# Patient Record
Sex: Female | Born: 1960 | Race: White | Hispanic: No | Smoking: Former smoker
Health system: Southern US, Community
[De-identification: ages and names within clinical notes are randomized; demographics above are authoritative.]

## PROBLEM LIST (undated history)

## (undated) DIAGNOSIS — F32A Depression, unspecified: Secondary | ICD-10-CM

## (undated) DIAGNOSIS — H4089 Other specified glaucoma: Secondary | ICD-10-CM

## (undated) DIAGNOSIS — F419 Anxiety disorder, unspecified: Secondary | ICD-10-CM

## (undated) DIAGNOSIS — F329 Major depressive disorder, single episode, unspecified: Secondary | ICD-10-CM

## (undated) DIAGNOSIS — H5712 Ocular pain, left eye: Secondary | ICD-10-CM

## (undated) DIAGNOSIS — C9110 Chronic lymphocytic leukemia of B-cell type not having achieved remission: Secondary | ICD-10-CM

## (undated) DIAGNOSIS — G44009 Cluster headache syndrome, unspecified, not intractable: Secondary | ICD-10-CM

## (undated) HISTORY — PX: REDUCTION MAMMAPLASTY: SUR839

## (undated) HISTORY — DX: Chronic lymphocytic leukemia of B-cell type not having achieved remission: C91.10

## (undated) HISTORY — DX: Depression, unspecified: F32.A

## (undated) HISTORY — DX: Other specified glaucoma: H40.89

## (undated) HISTORY — DX: Ocular pain, left eye: H57.12

## (undated) HISTORY — DX: Cluster headache syndrome, unspecified, not intractable: G44.009

## (undated) HISTORY — PX: CERVICAL DISC SURGERY: SHX588

## (undated) HISTORY — DX: Anxiety disorder, unspecified: F41.9

---
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
## 1999-09-05 ENCOUNTER — Encounter: Payer: Self-pay | Admitting: Neurosurgery

## 1999-09-10 ENCOUNTER — Encounter: Payer: Self-pay | Admitting: Neurosurgery

## 1999-09-10 ENCOUNTER — Inpatient Hospital Stay (HOSPITAL_COMMUNITY): Admission: RE | Admit: 1999-09-10 | Discharge: 1999-09-11 | Payer: Self-pay | Admitting: Neurosurgery

## 1999-09-30 ENCOUNTER — Encounter: Payer: Self-pay | Admitting: Neurosurgery

## 1999-09-30 ENCOUNTER — Encounter: Admission: RE | Admit: 1999-09-30 | Discharge: 1999-09-30 | Payer: Self-pay | Admitting: Neurosurgery

## 1999-11-21 ENCOUNTER — Encounter: Admission: RE | Admit: 1999-11-21 | Discharge: 1999-11-21 | Payer: Self-pay | Admitting: Neurosurgery

## 1999-11-21 ENCOUNTER — Encounter: Payer: Self-pay | Admitting: Neurosurgery

## 2000-06-11 ENCOUNTER — Other Ambulatory Visit: Admission: RE | Admit: 2000-06-11 | Discharge: 2000-06-11 | Payer: Self-pay | Admitting: Obstetrics and Gynecology

## 2001-04-23 ENCOUNTER — Other Ambulatory Visit: Admission: RE | Admit: 2001-04-23 | Discharge: 2001-04-23 | Payer: Self-pay | Admitting: Obstetrics and Gynecology

## 2003-01-03 ENCOUNTER — Other Ambulatory Visit: Admission: RE | Admit: 2003-01-03 | Discharge: 2003-01-03 | Payer: Self-pay | Admitting: Obstetrics and Gynecology

## 2003-01-10 ENCOUNTER — Encounter: Admission: RE | Admit: 2003-01-10 | Discharge: 2003-01-10 | Payer: Self-pay | Admitting: Obstetrics and Gynecology

## 2003-01-10 ENCOUNTER — Encounter: Payer: Self-pay | Admitting: Obstetrics and Gynecology

## 2003-01-13 ENCOUNTER — Encounter: Admission: RE | Admit: 2003-01-13 | Discharge: 2003-01-13 | Payer: Self-pay | Admitting: Obstetrics and Gynecology

## 2003-01-13 ENCOUNTER — Encounter: Payer: Self-pay | Admitting: Obstetrics and Gynecology

## 2003-07-12 ENCOUNTER — Ambulatory Visit (HOSPITAL_COMMUNITY): Admission: RE | Admit: 2003-07-12 | Discharge: 2003-07-12 | Payer: Self-pay | Admitting: Obstetrics and Gynecology

## 2004-06-07 ENCOUNTER — Other Ambulatory Visit: Admission: RE | Admit: 2004-06-07 | Discharge: 2004-06-07 | Payer: Self-pay | Admitting: *Deleted

## 2005-05-09 ENCOUNTER — Ambulatory Visit (HOSPITAL_COMMUNITY): Admission: RE | Admit: 2005-05-09 | Discharge: 2005-05-09 | Payer: Self-pay | Admitting: Obstetrics and Gynecology

## 2005-11-20 ENCOUNTER — Other Ambulatory Visit: Admission: RE | Admit: 2005-11-20 | Discharge: 2005-11-20 | Payer: Self-pay | Admitting: Obstetrics & Gynecology

## 2006-06-11 ENCOUNTER — Ambulatory Visit (HOSPITAL_COMMUNITY): Admission: RE | Admit: 2006-06-11 | Discharge: 2006-06-11 | Payer: Self-pay | Admitting: Obstetrics & Gynecology

## 2007-03-04 ENCOUNTER — Other Ambulatory Visit: Admission: RE | Admit: 2007-03-04 | Discharge: 2007-03-04 | Payer: Self-pay | Admitting: Obstetrics & Gynecology

## 2007-11-11 ENCOUNTER — Ambulatory Visit (HOSPITAL_COMMUNITY): Admission: RE | Admit: 2007-11-11 | Discharge: 2007-11-11 | Payer: Self-pay | Admitting: Obstetrics & Gynecology

## 2008-05-19 ENCOUNTER — Other Ambulatory Visit: Admission: RE | Admit: 2008-05-19 | Discharge: 2008-05-19 | Payer: Self-pay | Admitting: Obstetrics & Gynecology

## 2008-07-28 HISTORY — PX: AUGMENTATION MAMMAPLASTY: SUR837

## 2009-05-22 ENCOUNTER — Ambulatory Visit (HOSPITAL_COMMUNITY): Admission: RE | Admit: 2009-05-22 | Discharge: 2009-05-22 | Payer: Self-pay | Admitting: Obstetrics & Gynecology

## 2009-05-28 ENCOUNTER — Ambulatory Visit (HOSPITAL_COMMUNITY): Admission: RE | Admit: 2009-05-28 | Discharge: 2009-05-28 | Payer: Self-pay | Admitting: Obstetrics & Gynecology

## 2010-05-29 ENCOUNTER — Ambulatory Visit (HOSPITAL_COMMUNITY): Admission: RE | Admit: 2010-05-29 | Discharge: 2010-05-29 | Payer: Self-pay | Admitting: Internal Medicine

## 2010-06-05 ENCOUNTER — Emergency Department (HOSPITAL_COMMUNITY): Admission: EM | Admit: 2010-06-05 | Discharge: 2010-06-05 | Payer: Self-pay | Admitting: Emergency Medicine

## 2010-06-06 ENCOUNTER — Ambulatory Visit (HOSPITAL_COMMUNITY): Admission: RE | Admit: 2010-06-06 | Discharge: 2010-06-06 | Payer: Self-pay | Admitting: Emergency Medicine

## 2010-08-01 ENCOUNTER — Ambulatory Visit (HOSPITAL_COMMUNITY)
Admission: RE | Admit: 2010-08-01 | Discharge: 2010-08-01 | Payer: Self-pay | Source: Home / Self Care | Attending: Obstetrics & Gynecology | Admitting: Obstetrics & Gynecology

## 2010-08-17 ENCOUNTER — Encounter: Payer: Self-pay | Admitting: Obstetrics and Gynecology

## 2010-08-18 ENCOUNTER — Encounter: Payer: Self-pay | Admitting: Obstetrics & Gynecology

## 2010-10-08 LAB — DIFFERENTIAL
Eosinophils Relative: 1 % (ref 0–5)
Lymphocytes Relative: 38 % (ref 12–46)
Lymphs Abs: 2.2 10*3/uL (ref 0.7–4.0)
Monocytes Absolute: 0.5 10*3/uL (ref 0.1–1.0)
Monocytes Relative: 8 % (ref 3–12)

## 2010-10-08 LAB — BASIC METABOLIC PANEL
Chloride: 100 mEq/L (ref 96–112)
GFR calc Af Amer: >60 mL/min (ref >60)
Potassium: 3.6 mEq/L (ref 3.5–5.1)

## 2010-10-08 LAB — CBC
HCT: 36.3 % (ref 36.0–46.0)
Hemoglobin: 12.5 g/dL (ref 12.0–15.0)
MCV: 89 fL (ref 78.0–100.0)
RBC: 4.07 MIL/uL (ref 3.87–5.11)
WBC: 5.7 10*3/uL (ref 4.0–10.5)

## 2010-10-08 LAB — SEDIMENTATION RATE: Sed Rate: 10 mm/hr (ref 0–22)

## 2010-12-13 NOTE — H&P (Signed)
[place]. [facility]  Patient:    Danielle Kim                     MRN: [id number] Adm. Date:  [id number] Attending:  Danella Kim                         History and Physical  HISTORY OF PRESENT ILLNESS:  Danielle Kim is a 50 year old left-handed female ho had been complaining of shoulder pain with some improvement with medication. Nevertheless, during Christmas time, she was doing some kind of heavy lifting, nd developed a neck pain down to the left arm associated with a tingling sensation. The pain going also to the right side, and right now she is worse on the right ide than in the left one.  She had conservative treatment and she had no improvement whatsoever.  She called because she did not want to live with the pain.  She feels that she is getting worse, and she is having more numbness, mostly at the level of the middle and ring fingers.  Patient had an MRI and was sent to [place] for evaluation.  PAST MEDICAL HISTORY:  Negative.  SOCIAL HISTORY:  She drinks socially, does not smoke.  ALLERGIES:  She is not allergic to any medications.  FAMILY HISTORY:  Father is in good condition at 37, and mother is 24 without sign of disease.  REVIEW OF SYSTEMS:  Except for the neck and right arm pain, is negative.  PHYSICAL EXAMINATION:  GENERAL:  Patient came to my office and she was keeping her right hand on top of the head.  HEENT:  Normal.  NECK:  She was able to flex, but extension and lateralization were producing pain going medial into the right shoulder.  LUNGS:  Clear.  CARDIAC:  Heart sounds normal.  ABDOMEN:  Normal.  EXTREMITIES:  Normal pulses.  NEUROLOGIC:  Mental status normal.  Cranial nerves normal.  Strength is 5/5 in he left upper extremity.  In the right upper extremity, deltoids and biceps normal. She has weakness of the triceps, just like 1/5.  She has good strength of the thenar muscle in the right  hand but she is weak in the hypothenar.  Reflexes are symmetrical with exception of the right triceps.  She complains of tingling sensation along the C7 nerve root.  Coordination normal.  LABORATORY DATA:  The MRI shows she has a herniated disk at the level 5-6, 6-7 ith displacement of the spinal cord and extension bilaterally, right worse than the  left one.  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  RECOMMENDATION:  The patient wants to proceed with surgery.  She knows all the risks, such as infection, CSF leak, worsening pain, paralysis, damage to the vocal cord, and need of further surgery.  She declined second opinion. DD:  09/10/99 TD:  09/10/99 Job: [id number]

## 2010-12-13 NOTE — Discharge Summary (Signed)
[place]. [facility]  Patient:    Danielle Kim                     MRN: [id number] Adm. Date:  [id number] Disc. Date: 09/11/99 Attending:  Danella Penton                           Discharge Summary  ADMISSION DIAGNOSIS:  C6-7 herniated disk.  FINAL DIAGNOSIS:  C6-7 herniated disk.  CLINICAL HISTORY:  The patient was taken to surgery last night and an anterior 6-7 diskectomy followed by fusion was done.  The patient has a history of neck and right upper extremity pain associated with 1/5 weakness of the triceps.  LABORATORY DATA:  Normal.  HOSPITAL COURSE:  The patient had surgery last night and today, less than 24 hours, she is doing great and ready to go home.  CONDITION ON DISCHARGE:  Improved.  DISCHARGE MEDICATIONS:  Percocet and diazepam.  DIET:  Regular.  ACTIVITIES:  She is not to drive for 10 days.  FOLLOW-UP:  To be seen by me in three weeks. DD:  09/11/99 TD:  09/11/99 Job: [id number]

## 2010-12-13 NOTE — Op Note (Signed)
[place]. [facility]  Patient:    Danielle Kim                     MRN: [id number] Proc. Date: 09/10/99 Adm. Date:  [id number] Attending:  Danella Penton                           Operative Report  PREOPERATIVE DIAGNOSIS:  C6-C7 herniated disk with a right C7 radiculopathy.  POSTOPERATIVE DIAGNOSIS:  C6-C7 herniated disk with a right C7 radiculopathy.  PROCEDURE:  Anterior C6 and C7 diskectomy, decompression of the spinal cord, bilateral foraminotomy, removal of free fragment and spondylosis.  Bone graft 7 mm height, Synthes plate, microscope, Midas Rex.  SURGEON:  Tanya Nones. Jeral Fruit, M.D.  ASSISTANT:  Hewitt Shorts, M.D.  CLINICAL HISTORY:  The patient is a 50 year old female complaining of neck and right upper extremity pain  associated with weakness of the triceps being 1/5. She has failed conservative treatment.  X-ray showed a large herniated disk at the level C6-7 bilaterally, right worse than the left one.  Surgery was advised. he patient knew of the risks such as infection, CSF leak, worsening of the pain, paralysis, need for further surgery.  PROCEDURE:  The patient was taken to the OR and she was positioned in a supine manner.  The neck was prepped with Betadine.  Transverse incision through the skin, platysma was done until we found the cervical spine.  X-ray showed that indeed e were at the level C6-7.  The anterior ligament was opened.  We brought the microscope into the area.  With the microcuret, we did microdissection.  We removed all of the disk.  We found an opening in posterior ligament with at least 4-5 fragments of disk going to the right side.  Removal was done.  On the left side, mostly we found spondylosis.  Having good decompression of the spinal cord and bilateral foraminotomy, a 7 mm bone graft was inserted.  Plate from C6 to C7 used before a screw was done.  Then, x-ray showed good position of the  graft.  The area was irrigated.  He was done with bipolar and the wound closed with Vicryl and Steri-Strips. DD:  09/10/99 TD:  09/10/99 Job: [id number]

## 2011-02-07 ENCOUNTER — Other Ambulatory Visit: Payer: Self-pay | Admitting: Neurosurgery

## 2011-02-07 DIAGNOSIS — M549 Dorsalgia, unspecified: Secondary | ICD-10-CM

## 2011-02-07 DIAGNOSIS — M5410 Radiculopathy, site unspecified: Secondary | ICD-10-CM

## 2011-02-07 DIAGNOSIS — M542 Cervicalgia: Secondary | ICD-10-CM

## 2011-02-10 ENCOUNTER — Ambulatory Visit
Admission: RE | Admit: 2011-02-10 | Discharge: 2011-02-10 | Disposition: A | Discharge status: Home / Self Care | Payer: 59 | Source: Ambulatory Visit | Attending: Neurosurgery | Admitting: Neurosurgery

## 2011-02-10 DIAGNOSIS — M542 Cervicalgia: Secondary | ICD-10-CM

## 2011-02-10 DIAGNOSIS — M5410 Radiculopathy, site unspecified: Secondary | ICD-10-CM

## 2011-02-10 DIAGNOSIS — M549 Dorsalgia, unspecified: Secondary | ICD-10-CM

## 2011-02-10 MED ORDER — ONDANSETRON HCL 4 MG/2ML IJ SOLN
4.0000 mg | Freq: Once | INTRAMUSCULAR | Status: AC
  Administered 2011-02-10: 4 mg via INTRAMUSCULAR

## 2011-02-10 MED ORDER — MEPERIDINE HCL 100 MG/ML IJ SOLN
75.0000 mg | Freq: Once | INTRAMUSCULAR | Status: AC
  Administered 2011-02-10: 75 mg via INTRAMUSCULAR

## 2011-02-10 MED ORDER — IOHEXOL 300 MG/ML  SOLN
10.0000 mL | Freq: Once | INTRAMUSCULAR | Status: AC | PRN
  Administered 2011-02-10: 10 mL via INTRATHECAL

## 2011-02-10 MED ORDER — DIAZEPAM 2 MG PO TABS
10.0000 mg | ORAL_TABLET | Freq: Once | ORAL | Status: AC
  Administered 2011-02-10: 10 mg via ORAL

## 2011-02-11 ENCOUNTER — Telehealth: Payer: Self-pay | Admitting: Diagnostic Radiology

## 2011-09-22 ENCOUNTER — Other Ambulatory Visit: Payer: Self-pay | Admitting: Obstetrics & Gynecology

## 2011-09-22 DIAGNOSIS — Z139 Encounter for screening, unspecified: Secondary | ICD-10-CM

## 2011-09-25 ENCOUNTER — Ambulatory Visit (HOSPITAL_COMMUNITY)
Admission: RE | Admit: 2011-09-25 | Discharge: 2011-09-25 | Disposition: A | Discharge status: Home / Self Care | Payer: 59 | Source: Ambulatory Visit | Attending: Obstetrics & Gynecology | Admitting: Obstetrics & Gynecology

## 2011-09-25 DIAGNOSIS — Z1231 Encounter for screening mammogram for malignant neoplasm of breast: Secondary | ICD-10-CM | POA: Insufficient documentation

## 2011-09-25 DIAGNOSIS — Z139 Encounter for screening, unspecified: Secondary | ICD-10-CM

## 2012-04-21 ENCOUNTER — Telehealth: Payer: Self-pay

## 2012-04-21 NOTE — Telephone Encounter (Signed)
LMOM for a return call.  

## 2012-05-12 ENCOUNTER — Telehealth: Payer: Self-pay

## 2012-05-12 NOTE — Telephone Encounter (Signed)
Pt to call back this afternoon to complete triage.

## 2012-05-12 NOTE — Telephone Encounter (Signed)
See triage phone call.  

## 2012-05-19 NOTE — Telephone Encounter (Signed)
LMOM to call.

## 2012-06-07 NOTE — Telephone Encounter (Signed)
Letter to pt and PCP.  

## 2012-11-24 ENCOUNTER — Other Ambulatory Visit: Payer: Self-pay | Admitting: *Deleted

## 2012-11-24 MED ORDER — NITROFURANTOIN MONOHYD MACRO 100 MG PO CAPS: 100.0000 mg | ORAL_CAPSULE | Freq: Every day | ORAL | Status: DC

## 2012-11-24 NOTE — Telephone Encounter (Signed)
Ok to RF for one month.  Pt needs to be seen for AEX before being refilled again. 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

## 2012-11-24 NOTE — Telephone Encounter (Signed)
Faxed refill request received from pharmacy for NITROFURANTOIN M/M Last filled by MD on 06/30/11, #30 X 5 Last AEX - 07/08/12  Next AEX - not scheduled. Please advise refills.  Chart in your door.  Thanks.

## 2012-11-24 NOTE — Telephone Encounter (Signed)
Judeth Cornfield,  Previous statement was about another pt.  OK to RF #30/5RF.

## 2012-11-24 NOTE — Telephone Encounter (Signed)
RX sent

## 2013-01-03 ENCOUNTER — Other Ambulatory Visit: Payer: Self-pay | Admitting: Internal Medicine

## 2013-01-03 DIAGNOSIS — R51 Headache: Secondary | ICD-10-CM

## 2013-01-06 ENCOUNTER — Ambulatory Visit
Admission: RE | Admit: 2013-01-06 | Discharge: 2013-01-06 | Disposition: A | Discharge status: Home / Self Care | Payer: BC Managed Care – PPO | Source: Ambulatory Visit | Attending: Internal Medicine | Admitting: Internal Medicine

## 2013-01-06 DIAGNOSIS — R51 Headache: Secondary | ICD-10-CM

## 2013-01-06 MED ORDER — GADOBENATE DIMEGLUMINE 529 MG/ML IV SOLN
15.0000 mL | Freq: Once | INTRAVENOUS | Status: AC | PRN
  Administered 2013-01-06: 15 mL via INTRAVENOUS

## 2013-01-10 ENCOUNTER — Other Ambulatory Visit: Payer: 59

## 2013-01-20 ENCOUNTER — Telehealth: Payer: Self-pay

## 2013-01-20 NOTE — Telephone Encounter (Signed)
Called pt and she is in bed now, been up along time and she will call me back today or tomorrow to schedule colonoscopy.

## 2013-01-25 NOTE — Telephone Encounter (Signed)
LMOM to call.

## 2013-01-31 NOTE — Telephone Encounter (Signed)
Letter to pt and PCP.  

## 2013-02-07 DIAGNOSIS — H4052X3 Glaucoma secondary to other eye disorders, left eye, severe stage: Secondary | ICD-10-CM | POA: Insufficient documentation

## 2013-03-17 ENCOUNTER — Ambulatory Visit: Payer: Self-pay | Admitting: Neurology

## 2013-03-29 ENCOUNTER — Encounter: Payer: Self-pay | Admitting: Neurology

## 2013-03-29 ENCOUNTER — Ambulatory Visit (INDEPENDENT_AMBULATORY_CARE_PROVIDER_SITE_OTHER): Payer: BC Managed Care – PPO | Admitting: Neurology

## 2013-03-29 VITALS — BP 126/79 | HR 65 | Resp 16 | Ht 66.0 in | Wt 159.0 lb

## 2013-03-29 DIAGNOSIS — H4050X0 Glaucoma secondary to other eye disorders, unspecified eye, stage unspecified: Secondary | ICD-10-CM

## 2013-03-29 DIAGNOSIS — H4052X0 Glaucoma secondary to other eye disorders, left eye, stage unspecified: Secondary | ICD-10-CM

## 2013-03-29 DIAGNOSIS — H5710 Ocular pain, unspecified eye: Secondary | ICD-10-CM

## 2013-03-29 DIAGNOSIS — H5712 Ocular pain, left eye: Secondary | ICD-10-CM | POA: Insufficient documentation

## 2013-03-29 NOTE — Patient Instructions (Signed)

## 2013-03-29 NOTE — Progress Notes (Signed)
[facility]  Provider:  Melvyn Novas, M D  Referring Provider: Elfredia Nevins, MD Primary Care Physician:  Colette Ribas, MD  Chief Complaint  Patient presents with  . Headache- Parasthesias    # 10  New Patient    HPI:  Danielle Kim is a 52 y.o. female  Is seen here as a referral/ revisit  from Dr. Sherwood Gambler for an unusual retro orbital pain , behind the left eye.   The patient has a history of cervicalgia was treated by Dr. Jeral Fruit in 2001 . When she reported to him about her recovery and retro-orbital pain and changes in her vision he mentioned that he was concerned about possible multiple sclerosis. 2 we'll of her ophthalmologist did not share that opinion as she did not have optic neuritis symptoms. 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 The changes in vision have only affected her left eye. The patient covered her right eye she noticed not just a visual field deficit but would actually correlate with glaucoma damage, but also her perception for colors or change in the color Red is not longer bright red. Fingers left eye has been very difficult. I have met Mrs. Kim 3 years ago when she had retro-orbital pain, and after an MRI of the contrast was performed. At that time I also urged her to have a visual field examination as well as a repeat MRI of his contrast. This has meanwhile been obtained on 01-01-13 and shows no demyelinating lesions.  The patient also reports that at times she has a tingling dysesthesia or the left for head and temple area. She has no family history of vision loss in either parent or siblings. She continues to use glaucoma drops.   I discussed today the need to obtain autoimmune labs, to rule out vascular inflammatory changes.     Review of Systems: Out of a complete 14 system review, the patient complains  of only the following symptoms, and all other reviewed systems are negative.  vision changes, glaucoma  Left eye, retro-orbital pain left side only.     History   Social History  . Marital Status: Legally Separated    Spouse Name: N/A    Number of Children: 3  . Years of Education: 15   Occupational History  .     Social History Main Topics  . Smoking status: Current Some Day Smoker    Types: Cigarettes  . Smokeless tobacco: Never Used     Comment: One pack every month.  . Alcohol Use: 1.2 oz/week    2 Cans of beer per week     Comment: Social  . Drug Use: No  . Sexual Activity: Not on file   Other Topics Concern  . Not on file   Social History Narrative   Patient is a single mother. Patient works for [employer]. Patient has college education.   Caffeine- three cups coffee daily.   Left handed.    Family History  Problem Relation Age of Onset  . Heart attack Father   . Stroke Mother   . Diabetes Father   . Stomach cancer Maternal Aunt     Past Medical History  Diagnosis Date  . Cluster headaches   . Anxiety   . Other specified glaucoma   . Pain around left eye  several bouts  sicne 2011.     Past Surgical History  Procedure Laterality Date  . Cervical disc surgery      2001    Current Outpatient Prescriptions  Medication Sig Dispense Refill  . nitrofurantoin, macrocrystal-monohydrate, (MACROBID) 100 MG capsule Take 1 capsule (100 mg total) by mouth daily.  30 capsule  5  . Travoprost (TRAVATAN OP) Apply to eye. Left eye one drop at night.      . traZODone (DESYREL) 50 MG tablet Take 50 mg by mouth as needed. 1/2 tab at bedtime       No current facility-administered medications for this visit.    Allergies as of 03/29/2013  . (No Known Allergies)    Vitals: BP 126/79  Pulse 65  Resp 16  Ht 5\' 6"  (1.676 m)  Wt 159 lb (72.122 kg)  BMI 25.68 kg/m2 Last Weight:  Wt Readings from Last 1 Encounters:  03/29/13 159 lb (72.122 kg)    Last Height:   Ht Readings from Last 1 Encounters:  03/29/13 5\' 6"  (1.676 m)    Physical exam:  General: The patient is awake, alert and appears not in acute distress. The patient is well groomed. Head: Normocephalic, atraumatic. Neck is supple. Mallampati 2 , neck circumference:14.25 , nasal deviation.no  Ear pain.  Cardiovascular:  Regular rate and rhythm, without  murmurs or carotid bruit, and without distended neck veins. Respiratory: Lungs are clear to auscultation. Skin:  Without evidence of edema, or rash Trunk: BMI is normal, as is  posture.  Neurologic exam : The patient is awake and alert, oriented to place and time.  Memory subjective  described as intact. There is a normal attention span & concentration ability. Speech is fluent without  dysarthria, dysphonia or aphasia. Mood and affect are appropriate.  Cranial nerves: Pupils are equal and briskly reactive to light. Funduscopic exam shows a greenish sheen over the left eye, but it did found no evidence of avascular occlusion or scar. Finger perimetry is intact, extraocular movements were intact, PERRLA be reaction to light is intact but is not symmetric. The left eye pupil dilated after light was applied - delay of about 10 seconds,  which  did not occur in the right.  Extraocular movements  in vertical and horizontal planes intact and without nystagmus. Visual fields by finger perimetry are intact. Hearing to finger rub intact.  Facial sensation intact to fine touch. Facial motor strength is symmetric and tongue and uvula move midline.  Motor exam: Normal tone and normal muscle bulk and symmetric normal strength in all extremities.  Sensory:  Fine touch, pinprick and vibration were tested in all extremities. Proprioception is tested in the upper extremities only. This was normal.  Coordination: Rapid alternating movements in the fingers/hands is tested and normal. Finger-to-nose maneuver tested and normal without evidence  of ataxia, dysmetria or tremor.  Gait and station: Patient walks without assistive device and is able and assisted stool climb up to the exam table. Strength within normal limits. Stance is stable and normal. Tandem gait is fragmented. Romberg testing is normal.  Deep tendon reflexes: in the  upper and lower extremities are symmetric and intact. Babinski maneuver response is  downgoing.   Assessment:  After physical and neurologic examination, review of laboratory studies, imaging- assessement is that of GLAUCOMA. The possibility of a vasculitis is still not ruled out , but the MRI s/c contrast ruled out MS.    Plan:  Treatment plan and additional workup : ANA,  SS, ACE,

## 2013-03-30 ENCOUNTER — Other Ambulatory Visit: Payer: Self-pay | Admitting: Neurology

## 2013-03-31 ENCOUNTER — Telehealth: Payer: Self-pay | Admitting: *Deleted

## 2013-03-31 LAB — ANGIOTENSIN CONVERTING ENZYME: Angiotensin-Converting Enzyme: 20 U/L (ref 8–52)

## 2013-03-31 NOTE — Telephone Encounter (Signed)
completed

## 2013-04-01 LAB — PROTEIN ELECTROPHORESIS, SERUM
Beta 2: 3.8 % (ref 3.2–6.5)
Beta Globulin: 5.5 % (ref 4.7–7.2)
Gamma Globulin: 15.1 % (ref 11.1–18.8)

## 2013-04-05 ENCOUNTER — Encounter: Payer: Self-pay | Admitting: Neurology

## 2013-04-06 ENCOUNTER — Telehealth: Payer: Self-pay | Admitting: *Deleted

## 2013-04-06 ENCOUNTER — Other Ambulatory Visit (HOSPITAL_COMMUNITY): Payer: Self-pay | Admitting: Family Medicine

## 2013-04-06 DIAGNOSIS — Z139 Encounter for screening, unspecified: Secondary | ICD-10-CM

## 2013-04-06 NOTE — Telephone Encounter (Signed)
Pt called to schedule a TCS. Please advise [phone]. Pt states she is off today she has missed calls from [place] because she has been at work.

## 2013-04-07 ENCOUNTER — Telehealth: Payer: Self-pay | Admitting: Neurology

## 2013-04-07 NOTE — Telephone Encounter (Signed)
Left message for patient that labs were normal, per Dr. Vickey Huger.  Told to call with any questions.

## 2013-04-12 NOTE — Telephone Encounter (Signed)
Called pt. She is working and said to call her tomorrow morning at [phone] and she will be available.

## 2013-04-13 ENCOUNTER — Other Ambulatory Visit: Payer: Self-pay

## 2013-04-13 ENCOUNTER — Telehealth: Payer: Self-pay

## 2013-04-13 DIAGNOSIS — Z1211 Encounter for screening for malignant neoplasm of colon: Secondary | ICD-10-CM

## 2013-04-13 NOTE — Telephone Encounter (Signed)
LMOM she can try to call, very busy morning here. Or I will try to call back a little later.

## 2013-04-13 NOTE — Telephone Encounter (Signed)
Pt busy, can call back later this AM.

## 2013-04-14 ENCOUNTER — Other Ambulatory Visit: Payer: Self-pay | Admitting: *Deleted

## 2013-04-14 NOTE — Telephone Encounter (Signed)
Faxed refill request received from [facility] for TRAZODONE Last filled by MD on 03/22/12, #30 X 5RF Last AEX - 07/11/12 Next AEX - 07/12/13 Please advise refills.  Paper chart in basket.  Thanks.

## 2013-04-14 NOTE — Telephone Encounter (Signed)
Gastroenterology Pre-Procedure Review  Request Date: 04/13/2013 Requesting Physician: Dr. Sherwood Gambler  PATIENT REVIEW QUESTIONS: The patient responded to the following health history questions as indicated:    1. Diabetes Melitis: no 2. Joint replacements in the past 12 months: no 3. Major health problems in the past 3 months: no   JUST RECENTLY DIAGNOSED WITH GLAUCOMA 4. Has an artificial valve or MVP: no 5. Has a defibrillator: no 6. Has been advised in past to take antibiotics in advance of a procedure like teeth cleaning: no    MEDICATIONS & ALLERGIES:    Patient reports the following regarding taking any blood thinners:   Plavix? no Aspirin? no Coumadin? no  Patient confirms/reports the following medications:  Current Outpatient Prescriptions  Medication Sig Dispense Refill  . dorzolamide (TRUSOPT) 2 % ophthalmic solution Place 1 drop into both eyes 1 day or 1 dose.      . ibuprofen (ADVIL,MOTRIN) 200 MG tablet Take 200 mg by mouth every 6 (six) hours as needed for pain. Pt only takes as needed which is not often      . Travoprost (TRAVATAN OP) Apply to eye. Left eye one drop at night.      . traZODone (DESYREL) 50 MG tablet Take 50 mg by mouth as needed. 1/2 tab at bedtime       No current facility-administered medications for this visit.    Patient confirms/reports the following allergies:  No Known Allergies  No orders of the defined types were placed in this encounter.    AUTHORIZATION INFORMATION Primary Insurance:   ID #:   Group #:  Pre-Cert / Auth required:  Pre-Cert / Auth #:   Secondary Insurance:   ID #:   Group #:  Pre-Cert / Auth required: Pre-Cert / Auth #:   SCHEDULE INFORMATION: Procedure has been scheduled as follows:  Date:  04/27/2013     Time: 9:15 am  Location: [facility] Short Stay  This Gastroenterology Pre-Precedure Review Form is being routed to the following provider(s): Dr. Jena Gauss

## 2013-04-15 MED ORDER — TRAZODONE HCL 50 MG PO TABS: 50.0000 mg | ORAL_TABLET | ORAL | Status: DC | PRN

## 2013-04-15 NOTE — Telephone Encounter (Signed)
Done.  Call in rx personally.

## 2013-04-18 NOTE — Telephone Encounter (Signed)
Appropriate.

## 2013-04-19 ENCOUNTER — Ambulatory Visit (HOSPITAL_COMMUNITY)
Admission: RE | Admit: 2013-04-19 | Discharge: 2013-04-19 | Disposition: A | Discharge status: Home / Self Care | Payer: BC Managed Care – PPO | Source: Ambulatory Visit | Attending: Family Medicine | Admitting: Family Medicine

## 2013-04-19 DIAGNOSIS — Z139 Encounter for screening, unspecified: Secondary | ICD-10-CM

## 2013-04-19 DIAGNOSIS — Z1231 Encounter for screening mammogram for malignant neoplasm of breast: Secondary | ICD-10-CM | POA: Insufficient documentation

## 2013-04-19 MED ORDER — PEG-KCL-NACL-NASULF-NA ASC-C 100 G PO SOLR: 1.0000 | ORAL | Status: DC

## 2013-04-19 NOTE — Telephone Encounter (Signed)
Rx sent to the pharmacy and instructions mailed to pt.  

## 2013-04-25 ENCOUNTER — Telehealth: Payer: Self-pay | Admitting: Internal Medicine

## 2013-04-25 ENCOUNTER — Other Ambulatory Visit: Payer: Self-pay | Admitting: Family Medicine

## 2013-04-25 DIAGNOSIS — R928 Other abnormal and inconclusive findings on diagnostic imaging of breast: Secondary | ICD-10-CM

## 2013-04-25 NOTE — Telephone Encounter (Signed)
Pt cancelled her tcs for Wednesday with RMR. She doesn't have anyone that can come with her on that day. She said she would call back to [facility] once she can line up transportation.

## 2013-04-26 ENCOUNTER — Encounter: Payer: Self-pay | Admitting: Neurology

## 2013-04-27 ENCOUNTER — Encounter (HOSPITAL_COMMUNITY): Admission: RE | Payer: Self-pay | Source: Ambulatory Visit

## 2013-04-27 ENCOUNTER — Ambulatory Visit (HOSPITAL_COMMUNITY)
Admission: RE | Admit: 2013-04-27 | Payer: BC Managed Care – PPO | Source: Ambulatory Visit | Admitting: Internal Medicine

## 2013-04-27 SURGERY — COLONOSCOPY
Anesthesia: Moderate Sedation

## 2013-05-05 ENCOUNTER — Other Ambulatory Visit: Payer: Self-pay | Admitting: Family Medicine

## 2013-05-05 ENCOUNTER — Ambulatory Visit
Admission: RE | Admit: 2013-05-05 | Discharge: 2013-05-05 | Disposition: A | Discharge status: Home / Self Care | Payer: BC Managed Care – PPO | Source: Ambulatory Visit | Attending: Family Medicine | Admitting: Family Medicine

## 2013-05-05 DIAGNOSIS — R928 Other abnormal and inconclusive findings on diagnostic imaging of breast: Secondary | ICD-10-CM

## 2013-06-17 ENCOUNTER — Encounter: Payer: Self-pay | Admitting: Obstetrics & Gynecology

## 2013-06-22 ENCOUNTER — Telehealth: Payer: Self-pay | Admitting: Obstetrics & Gynecology

## 2013-06-22 NOTE — Telephone Encounter (Signed)
Patient's aex with Dr. Hyacinth Meeker  was rescheduled to Jan. 2015. Patient says she must be seen in December.

## 2013-07-01 NOTE — Telephone Encounter (Signed)
Per pre-EPIC chart, last AEX 07-11-12 with Dr Farrel Gobble. Has also seen Debbi.  Very limited appointments from 07-12-13 (1 year since last AEX) and end of year, do I need to make exception?

## 2013-07-04 NOTE — Telephone Encounter (Signed)
Danielle Kim, Can you find a place on my schedule for her please?  Thanks.

## 2013-07-07 ENCOUNTER — Telehealth: Payer: Self-pay | Admitting: *Deleted

## 2013-07-07 ENCOUNTER — Encounter: Payer: Self-pay | Admitting: Internal Medicine

## 2013-07-07 NOTE — Telephone Encounter (Signed)
Patient is scheduled with Dr. Hyacinth Meeker 07/13/13 for her AEX. The patient wants to know if she can have her IUD removed at this appointment as well? She is not wanting another one inserted.

## 2013-07-07 NOTE — Telephone Encounter (Signed)
Call to patient to offer appointment with Dr Hyacinth Meeker as previously requested. LMTCB. Appts available on 07-13-13 in the afternoon.

## 2013-07-07 NOTE — Telephone Encounter (Signed)
Notified that yes, IUD can be removed at same time Separate charge so will need precert but fine to do at AEX.  Pt reports she has only had two periods in six months and is not sexually active.  Should she have it removed?  Advised that DR Hyacinth Meeker will review with her but if not due to be removed, may benefit from the suppression of the progesterone, especially at age 52.  Can review with Dr Hyacinth Meeker and if decides to have it removed, ok to do at AEX.  Routing to provider for final review. Patient agreeable to disposition. Will close encounter

## 2013-07-11 ENCOUNTER — Ambulatory Visit: Payer: Self-pay | Admitting: Certified Nurse Midwife

## 2013-07-11 ENCOUNTER — Telehealth: Payer: Self-pay | Admitting: *Deleted

## 2013-07-11 NOTE — Telephone Encounter (Signed)
For possible IUD removal at AEX.  Will be discussing with MD and precert done in case IUD is removed.    Routing to provider for final review.  Will close encounter

## 2013-07-12 ENCOUNTER — Ambulatory Visit: Payer: Self-pay | Admitting: Obstetrics & Gynecology

## 2013-07-12 ENCOUNTER — Encounter: Payer: Self-pay | Admitting: Obstetrics & Gynecology

## 2013-07-13 ENCOUNTER — Ambulatory Visit: Payer: Self-pay | Admitting: Obstetrics & Gynecology

## 2013-07-13 ENCOUNTER — Telehealth: Payer: Self-pay

## 2013-07-13 NOTE — Telephone Encounter (Signed)
See appt desk. Appt scheduled with Dr Hyacinth Meeker.

## 2013-07-13 NOTE — Telephone Encounter (Signed)
Lmtcb will need to reschedule AEX today due to delayed in surgery//kn

## 2013-07-14 ENCOUNTER — Ambulatory Visit: Payer: Self-pay | Admitting: Certified Nurse Midwife

## 2013-07-19 ENCOUNTER — Encounter: Payer: Self-pay | Admitting: Obstetrics & Gynecology

## 2013-07-19 ENCOUNTER — Ambulatory Visit (INDEPENDENT_AMBULATORY_CARE_PROVIDER_SITE_OTHER): Payer: BC Managed Care – PPO | Admitting: Obstetrics & Gynecology

## 2013-07-19 VITALS — BP 102/64 | HR 78 | Resp 16 | Ht 66.0 in | Wt 160.0 lb

## 2013-07-19 DIAGNOSIS — Z0000 Encounter for general adult medical examination without abnormal findings: Secondary | ICD-10-CM

## 2013-07-19 DIAGNOSIS — Z01419 Encounter for gynecological examination (general) (routine) without abnormal findings: Secondary | ICD-10-CM

## 2013-07-19 LAB — POCT URINALYSIS DIPSTICK
Blood, UA: NEGATIVE
Ketones, UA: NEGATIVE
Protein, UA: NEGATIVE
pH, UA: 6

## 2013-07-19 NOTE — Patient Instructions (Signed)

## 2013-07-19 NOTE — Progress Notes (Signed)
52 y.o. G3P3 Legally SeparatedCaucasianF here for annual exam.  Doing really well.  Older two are living on their own.  Danielle Kim is 15 and doing well.  Cycles are getting less frequent.  When bleeds, is light and short.  IUD placed 12/12.    Major issue for her is glaucoma in her left eye and headaches on the left side.  Being seen at [facility] next week.  Did [address] run this year.  Did a 10K as well.  Does speed walking.  Patient's last menstrual period was 06/26/2013.          Sexually active: no  The current method of family planning is IUD.    Exercising: yes  Walking 2-3 days a week Smoker:  Yes    5-6 cigarettes a month  Health Maintenance: Pap: 07/08/12 neg, neg HR HPV History of abnormal Pap:  Yes  1979  Colp/Bx and Cryo MMG: 05/2013 first was abnormal at [facility].  Follow up was normal at the [facility]. Colonoscopy: Scheduled 08/2013 BMD:   never TDaP: 8/11 Screening Labs: just did with insurance, Hb today: PCP, Urine today: neg   reports that she has been smoking Cigarettes.  She has been smoking about 0.00 packs per day. She has never used smokeless tobacco. She reports that she drinks about 0.6 ounces of alcohol per week. She reports that she does not use illicit drugs.  Past Medical History  Diagnosis Date  . Cluster headaches   . Anxiety   . Other specified glaucoma   . Pain around left eye      several bouts  sicne 2011.     Past Surgical History  Procedure Laterality Date  . Cervical disc surgery      2001  . Augmentation mammaplasty  2010    Current Outpatient Prescriptions  Medication Sig Dispense Refill  . ibuprofen (ADVIL,MOTRIN) 200 MG tablet Take 200 mg by mouth every 6 (six) hours as needed for pain. Pt only takes as needed which is not often      . Travoprost (TRAVATAN OP) Apply to eye. Left eye one drop at night.      . traZODone (DESYREL) 50 MG tablet Take 1 tablet (50 mg total) by mouth as needed.  15 tablet  1   No current  facility-administered medications for this visit.    Family History  Problem Relation Age of Onset  . Heart attack Father   . Diabetes Father   . Stroke Mother   . Heart attack Mother   . Stomach cancer Maternal Aunt     ROS:  Pertinent items are noted in HPI.  Otherwise, a comprehensive ROS was negative.  Exam:   BP 102/64  Pulse 78  Resp 16  Ht 5\' 6"  (1.676 m)  Wt 160 lb (72.576 kg)  BMI 25.84 kg/m2  LMP 06/26/2013  Weight change: -4lb  Height: 5\' 6"  (167.6 cm)  Ht Readings from Last 3 Encounters:  07/19/13 5\' 6"  (1.676 m)  03/29/13 5\' 6"  (1.676 m)  01/01/13 5\' 6"  (1.676 m)    General appearance: alert, cooperative and appears stated age Head: Normocephalic, without obvious abnormality, atraumatic Neck: no adenopathy, supple, symmetrical, trachea midline and thyroid normal to inspection and palpation Lungs: clear to auscultation bilaterally Breasts: normal appearance, no masses or tenderness Heart: regular rate and rhythm Abdomen: soft, non-tender; bowel sounds normal; no masses,  no organomegaly Extremities: extremities normal, atraumatic, no cyanosis or edema Skin: Skin color, texture, turgor normal. No  rashes or lesions Lymph nodes: Cervical, supraclavicular, and axillary nodes normal. No abnormal inguinal nodes palpated Neurologic: Grossly normal   Pelvic: External genitalia:  no lesions              Urethra:  normal appearing urethra with no masses, tenderness or lesions              Bartholins and Skenes: normal                 Vagina: normal appearing vagina with normal color and discharge, no lesions              Cervix: no lesions              Pap taken: no Bimanual Exam:  Uterus:  normal size, contour, position, consistency, mobility, non-tender              Adnexa: normal adnexa and no mass, fullness, tenderness               Rectovaginal: Confirms               Anus:  normal sphincter tone, no lesions  A:  Well Woman with normal exam Has Mirena IUD  12/12 H/O depression Smoker  P:   Mammogram yearly. pap smear with neg HR HPV 2013. Labs for insurance done this summer. return annually or prn  An After Visit Summary was printed and given to the patient.

## 2013-07-27 NOTE — Telephone Encounter (Signed)
Appointment made for 07/19/13//kn

## 2013-08-01 ENCOUNTER — Other Ambulatory Visit: Payer: Self-pay

## 2013-08-01 MED ORDER — TRAZODONE HCL 50 MG PO TABS: 50.0000 mg | ORAL_TABLET | Freq: Every evening | ORAL | Status: DC | PRN

## 2013-08-01 NOTE — Telephone Encounter (Signed)
Patient had AEX on 07/19/13. Last filled 04/15/13 #15/1 RF. Please sign rx if ok to refill. Chart is on your desk if needed.

## 2013-08-08 ENCOUNTER — Ambulatory Visit: Payer: Self-pay | Admitting: Obstetrics & Gynecology

## 2013-08-24 ENCOUNTER — Telehealth: Payer: Self-pay | Admitting: Obstetrics & Gynecology

## 2013-08-31 ENCOUNTER — Ambulatory Visit (AMBULATORY_SURGERY_CENTER): Payer: Commercial Managed Care - PPO | Admitting: *Deleted

## 2013-08-31 VITALS — Ht 66.0 in | Wt 157.8 lb

## 2013-08-31 DIAGNOSIS — Z1211 Encounter for screening for malignant neoplasm of colon: Secondary | ICD-10-CM

## 2013-08-31 MED ORDER — MOVIPREP 100 G PO SOLR: ORAL | Status: DC

## 2013-08-31 NOTE — Progress Notes (Signed)
No allergies to eggs or soy. No problems with anesthesia.  

## 2013-09-02 ENCOUNTER — Encounter: Payer: Self-pay | Admitting: Internal Medicine

## 2013-09-07 ENCOUNTER — Telehealth: Payer: Self-pay | Admitting: Internal Medicine

## 2013-09-07 NOTE — Telephone Encounter (Signed)
Spoke with patient. She explained that her pharmacy told her she could get generic prep cheaper than moviprep. Moviprep will cost her $40. Explained to patient that Dr.Pyrtle uses this prep and that there is no generic for moviprep. She understands and will be able to get the prep.

## 2013-09-14 ENCOUNTER — Encounter: Payer: Self-pay | Admitting: Internal Medicine

## 2013-09-14 ENCOUNTER — Ambulatory Visit (AMBULATORY_SURGERY_CENTER): Payer: Commercial Managed Care - PPO | Admitting: Internal Medicine

## 2013-09-14 ENCOUNTER — Encounter: Payer: BC Managed Care – PPO | Admitting: Internal Medicine

## 2013-09-14 VITALS — BP 124/85 | HR 42 | Temp 96.0°F | Resp 28 | Ht 66.0 in | Wt 157.0 lb

## 2013-09-14 DIAGNOSIS — Z1211 Encounter for screening for malignant neoplasm of colon: Secondary | ICD-10-CM

## 2013-09-14 MED ORDER — SODIUM CHLORIDE 0.9 % IV SOLN: 500.0000 mL | INTRAVENOUS | Status: DC

## 2013-09-14 NOTE — Patient Instructions (Signed)
Discharge instructions given with verbal understanding. Normal exam. Resume previous medications. YOU HAD AN ENDOSCOPIC PROCEDURE TODAY AT THE [facility]: Refer to the procedure report that was given to you for any specific questions about what was found during the examination.  If the procedure report does not answer your questions, please call your gastroenterologist to clarify.  If you requested that your care partner not be given the details of your procedure findings, then the procedure report has been included in a sealed envelope for you to review at your convenience later.  YOU SHOULD EXPECT: Some feelings of bloating in the abdomen. Passage of more gas than usual.  Walking can help get rid of the air that was put into your GI tract during the procedure and reduce the bloating. If you had a lower endoscopy (such as a colonoscopy or flexible sigmoidoscopy) you may notice spotting of blood in your stool or on the toilet paper. If you underwent a bowel prep for your procedure, then you may not have a normal bowel movement for a few days.  DIET: Your first meal following the procedure should be a light meal and then it is ok to progress to your normal diet.  A half-sandwich or bowl of soup is an example of a good first meal.  Heavy or fried foods are harder to digest and may make you feel nauseous or bloated.  Likewise meals heavy in dairy and vegetables can cause extra gas to form and this can also increase the bloating.  Drink plenty of fluids but you should avoid alcoholic beverages for 24 hours.  ACTIVITY: Your care partner should take you home directly after the procedure.  You should plan to take it easy, moving slowly for the rest of the day.  You can resume normal activity the day after the procedure however you should NOT DRIVE or use heavy machinery for 24 hours (because of the sedation medicines used during the test).    SYMPTOMS TO REPORT IMMEDIATELY: A gastroenterologist  can be reached at any hour.  During normal business hours, 8:30 AM to 5:00 PM Monday through Friday, call [phone].  After hours and on weekends, please call the GI answering service at [phone] who will take a message and have the physician on call contact you.   Following lower endoscopy (colonoscopy or flexible sigmoidoscopy):  Excessive amounts of blood in the stool  Significant tenderness or worsening of abdominal pains  Swelling of the abdomen that is new, acute  Fever of 100F or higher  FOLLOW UP: If any biopsies were taken you will be contacted by phone or by letter within the next 1-3 weeks.  Call your gastroenterologist if you have not heard about the biopsies in 3 weeks.  Our staff will call the home number listed on your records the next business day following your procedure to check on you and address any questions or concerns that you may have at that time regarding the information given to you following your procedure. This is a courtesy call and so if there is no answer at the home number and we have not heard from you through the emergency physician on call, we will assume that you have returned to your regular daily activities without incident.  SIGNATURES/CONFIDENTIALITY: You and/or your care partner have signed paperwork which will be entered into your electronic medical record.  These signatures attest to the fact that that the information above on your After Visit Summary has been reviewed   and is understood.  Full responsibility of the confidentiality of this discharge information lies with you and/or your care-partner. 

## 2013-09-14 NOTE — Op Note (Addendum)
[address]  [address] [place], [address]   COLONOSCOPY PROCEDURE REPORT  PATIENT: Danielle Kim, Danielle Kim  MR#: [id number] BIRTHDATE: 1961-07-27 , 52  yrs. old GENDER: Female ENDOSCOPIST: Jerene Bears, MD REFERRED NI:DPOE Hilma Favors, M.D. PROCEDURE DATE:  09/14/2013 PROCEDURE:   Colonoscopy, screening First Screening Colonoscopy - Avg.  risk and is 50 yrs.  old or older Yes.  Prior Negative Screening - Now for repeat screening. N/A  History of Adenoma - Now for follow-up colonoscopy & has been > or = to 3 yrs.  N/A  Polyps Removed Today? No.  Recommend repeat exam, <10 yrs? No. ASA CLASS:   Class II INDICATIONS:average risk screening and first colonoscopy. MEDICATIONS: MAC sedation, administered by CRNA and propofol (Diprivan) 350mg  IV  DESCRIPTION OF PROCEDURE:   After the risks benefits and alternatives of the procedure were thoroughly explained, informed consent was obtained.  A digital rectal exam revealed no rectal mass.   The LB [id number]  endoscope was introduced through the anus and advanced to the cecum, which was identified by both the appendix and ileocecal valve. No adverse events experienced. The quality of the prep was good, using MoviPrep  The instrument was then slowly withdrawn as the colon was fully examined.    COLON FINDINGS: A normal appearing cecum, ileocecal valve, and appendiceal orifice were identified.  The ascending, hepatic flexure, transverse, splenic flexure, descending, sigmoid colon and rectum appeared unremarkable.  No polyps or cancers were seen. Retroflexed views revealed no abnormalities. The time to cecum=5 minutes 31 seconds.  Withdrawal time=14 minutes 01 seconds.  The scope was withdrawn and the procedure completed.  COMPLICATIONS: There were no complications.  ENDOSCOPIC IMPRESSION: Normal colon  RECOMMENDATIONS: You should continue to follow colorectal cancer screening guidelines for "routine risk"  patients with a repeat colonoscopy in 10 years. There is no need for FOBT (stool) testing for at least 5 years.   eSigned:  Jerene Bears, MD 09/14/2013 9:33 AM   cc: The Patient and Sharilyn Sites, MD   PATIENT NAME:  [name], [name] MR#: [id number]

## 2013-09-14 NOTE — Progress Notes (Signed)
Report to pacu rn, vss, bbs=clear 

## 2013-09-15 ENCOUNTER — Telehealth: Payer: Self-pay

## 2013-09-15 NOTE — Telephone Encounter (Signed)
  Follow up Call-  Call back number 09/14/2013  Post procedure Call Back phone  # [phone]  Permission to leave phone message Yes     Patient questions:  Do you have a fever, pain , or abdominal swelling? no Pain Score  0 *  Have you tolerated food without any problems? yes  Have you been able to return to your normal activities? yes  Do you have any questions about your discharge instructions: Diet   no Medications  no Follow up visit  no  Do you have questions or concerns about your Care? no  Actions: * If pain score is 4 or above: No action needed, pain <4.

## 2013-09-28 ENCOUNTER — Other Ambulatory Visit (HOSPITAL_COMMUNITY): Payer: Self-pay | Admitting: Family Medicine

## 2013-09-28 DIAGNOSIS — Z0000 Encounter for general adult medical examination without abnormal findings: Secondary | ICD-10-CM

## 2013-10-05 ENCOUNTER — Ambulatory Visit (HOSPITAL_COMMUNITY)
Admission: RE | Admit: 2013-10-05 | Discharge: 2013-10-05 | Disposition: A | Discharge status: Home / Self Care | Payer: Commercial Managed Care - PPO | Source: Ambulatory Visit | Attending: Family Medicine | Admitting: Family Medicine

## 2013-10-05 DIAGNOSIS — Z0000 Encounter for general adult medical examination without abnormal findings: Secondary | ICD-10-CM

## 2013-10-06 ENCOUNTER — Other Ambulatory Visit (HOSPITAL_COMMUNITY): Payer: Commercial Managed Care - PPO

## 2014-03-15 ENCOUNTER — Other Ambulatory Visit: Payer: Self-pay | Admitting: *Deleted

## 2014-03-15 NOTE — Telephone Encounter (Signed)
Pharmacy is calling to make sure we got the rx request said they got a response saying it was denied because they sent it over as Danielle Kim the pts new last name

## 2014-03-15 NOTE — Telephone Encounter (Signed)
[employer] logistics/support/administrative officer from [place] requesting Trazodone Refill  Last AEX 07/19/13 Last refill 08/01/13 #15/1 R Next appt 03/31/14  Please advise.

## 2014-03-16 MED ORDER — TRAZODONE HCL 50 MG PO TABS: 50.0000 mg | ORAL_TABLET | Freq: Every evening | ORAL | Status: DC | PRN

## 2014-03-16 NOTE — Telephone Encounter (Signed)
Rx printed and signed by Dr. Sabra Heck, faxed to [facility].

## 2014-03-31 ENCOUNTER — Encounter: Payer: Self-pay | Admitting: Obstetrics & Gynecology

## 2014-03-31 ENCOUNTER — Ambulatory Visit (INDEPENDENT_AMBULATORY_CARE_PROVIDER_SITE_OTHER): Payer: Commercial Managed Care - PPO | Admitting: Obstetrics & Gynecology

## 2014-03-31 VITALS — BP 138/84 | HR 60 | Resp 16 | Ht 65.5 in | Wt 146.6 lb

## 2014-03-31 DIAGNOSIS — G4700 Insomnia, unspecified: Secondary | ICD-10-CM

## 2014-03-31 DIAGNOSIS — Z124 Encounter for screening for malignant neoplasm of cervix: Secondary | ICD-10-CM

## 2014-03-31 DIAGNOSIS — Z01419 Encounter for gynecological examination (general) (routine) without abnormal findings: Secondary | ICD-10-CM

## 2014-03-31 MED ORDER — TRAZODONE HCL 50 MG PO TABS: 50.0000 mg | ORAL_TABLET | Freq: Every evening | ORAL | Status: DC | PRN

## 2014-03-31 NOTE — Progress Notes (Signed)
53 y.o. G3P3 Divroced CaucasianF here for annual exam.  Minimal vaginal bleeding about every other month.  No hot flashes.  Having some increased insomnia.  Feel her new eye drop is causing nausea as well.  Will follow up with eye doctor.   Patient's last menstrual period was 03/06/2014.          Sexually active: No.  The current method of family planning is IUD.    Exercising: Yes.    walking Smoker:  Former smoker-quit 9 months ago  Health Maintenance: Pap:  07/08/12 WNL/negative HR HPV History of abnormal Pap:  yes MMG:  04/20/13-MMG, 05/05/13-additional views-normal Colonoscopy:  2015-repeat in 10 years, IFOB testing recommended starting 2010. BMD:   none TDaP:  2011 Screening Labs: PCP, Hb today: PCP, Urine today: PCP   reports that she has quit smoking. Her smoking use included Cigarettes. She smoked 0.00 packs per day. She has never used smokeless tobacco. She reports that she drinks about 1.8 - 3 ounces of alcohol per week. She reports that she does not use illicit drugs.  Past Medical History  Diagnosis Date  . Cluster headaches   . Anxiety   . Other specified glaucoma   . Pain around left eye      several bouts  sicne 2011.     Past Surgical History  Procedure Laterality Date  . Cervical disc surgery      2001  . Augmentation mammaplasty  2010    Current Outpatient Prescriptions  Medication Sig Dispense Refill  . ibuprofen (ADVIL,MOTRIN) 200 MG tablet Take 200 mg by mouth every 6 (six) hours as needed for pain. Pt only takes as needed which is not often      . Multiple Vitamin (MULTIVITAMIN) tablet Take 1 tablet by mouth daily.      . timolol (BETIMOL) 0.25 % ophthalmic solution 1-2 drops 2 (two) times daily.      . Travoprost (TRAVATAN OP) Apply to eye. Left eye one drop at night.      . traZODone (DESYREL) 50 MG tablet Take 1 tablet (50 mg total) by mouth at bedtime as needed.  15 tablet  2   No current facility-administered medications for this visit.     Family History  Problem Relation Age of Onset  . Heart attack Father   . Diabetes Father   . Stroke Mother   . Heart attack Mother   . Stomach cancer Maternal Aunt   . Colon cancer Neg Hx     ROS:  Pertinent items are noted in HPI.  Otherwise, a comprehensive ROS was negative.  Exam:   BP 138/84  Pulse 60  Resp 16  Ht 5' 5.5" (1.664 m)  Wt 146 lb 9.6 oz (66.497 kg)  BMI 24.02 kg/m2  LMP 03/06/2014  Weight change: -14#  Height: 5' 5.5" (166.4 cm)  Ht Readings from Last 3 Encounters:  03/31/14 5' 5.5" (1.664 m)  09/14/13 5\' 6"  (1.676 m)  08/31/13 5\' 6"  (1.676 m)    General appearance: alert, cooperative and appears stated age Head: Normocephalic, without obvious abnormality, atraumatic Neck: no adenopathy, supple, symmetrical, trachea midline and thyroid normal to inspection and palpation Lungs: clear to auscultation bilaterally Breasts: normal appearance, no masses or tenderness Heart: regular rate and rhythm Abdomen: soft, non-tender; bowel sounds normal; no masses,  no organomegaly Extremities: extremities normal, atraumatic, no cyanosis or edema Skin: Skin color, texture, turgor normal. No rashes or lesions Lymph nodes: Cervical, supraclavicular, and axillary nodes normal. No abnormal  inguinal nodes palpated Neurologic: Grossly normal   Pelvic: External genitalia:  no lesions              Urethra:  normal appearing urethra with no masses, tenderness or lesions              Bartholins and Skenes: normal                 Vagina: normal appearing vagina with normal color and discharge, no lesions              Cervix: no lesions, IUD string noted              Pap taken: Yes.   Bimanual Exam:  Uterus:  normal size, contour, position, consistency, mobility, non-tender              Adnexa: normal adnexa and no mass, fullness, tenderness               Rectovaginal: Confirms               Anus:  normal sphincter tone, no lesions  A:  Well Woman with normal exam  Has  Mirena IUD 12/12  H/O depression  Increased insomnia Smoker   P: Mammogram yearly.  pap smear with neg HR HPV 2013.  Pap today. Labs for insurance done in May.  Reports this was all normal.  Trazadone 29m qhs prn insomnia.  #30/5RF. return annually or prn  An After Visit Summary was printed and given to the patient.

## 2014-04-05 LAB — IPS PAP TEST WITH REFLEX TO HPV

## 2014-05-05 ENCOUNTER — Ambulatory Visit (INDEPENDENT_AMBULATORY_CARE_PROVIDER_SITE_OTHER): Payer: Commercial Managed Care - PPO | Admitting: Certified Nurse Midwife

## 2014-05-05 ENCOUNTER — Encounter: Payer: Self-pay | Admitting: Certified Nurse Midwife

## 2014-05-05 VITALS — BP 110/64 | HR 68 | Resp 16 | Ht 65.5 in | Wt 143.0 lb

## 2014-05-05 DIAGNOSIS — B9689 Other specified bacterial agents as the cause of diseases classified elsewhere: Secondary | ICD-10-CM

## 2014-05-05 DIAGNOSIS — A499 Bacterial infection, unspecified: Secondary | ICD-10-CM

## 2014-05-05 DIAGNOSIS — Z113 Encounter for screening for infections with a predominantly sexual mode of transmission: Secondary | ICD-10-CM

## 2014-05-05 DIAGNOSIS — N760 Acute vaginitis: Secondary | ICD-10-CM

## 2014-05-05 MED ORDER — METRONIDAZOLE 0.75 % VA GEL: VAGINAL | Status: DC

## 2014-05-05 NOTE — Patient Instructions (Addendum)
Bacterial Vaginosis Bacterial vaginosis is a vaginal infection that occurs when the normal balance of bacteria in the vagina is disrupted. It results from an overgrowth of certain bacteria. This is the most common vaginal infection in women of childbearing age. Treatment is important to prevent complications, especially in pregnant women, as it can cause a premature delivery. CAUSES  Bacterial vaginosis is caused by an increase in harmful bacteria that are normally present in smaller amounts in the vagina. Several different kinds of bacteria can cause bacterial vaginosis. However, the reason that the condition develops is not fully understood. RISK FACTORS Certain activities or behaviors can put you at an increased risk of developing bacterial vaginosis, including:  Having a new sex partner or multiple sex partners.  Douching.  Using an intrauterine device (IUD) for contraception. Women do not get bacterial vaginosis from toilet seats, bedding, swimming pools, or contact with objects around them. SIGNS AND SYMPTOMS  Some women with bacterial vaginosis have no signs or symptoms. Common symptoms include:  Grey vaginal discharge.  A fishlike odor with discharge, especially after sexual intercourse.  Itching or burning of the vagina and vulva.  Burning or pain with urination. DIAGNOSIS  Your health care provider will take a medical history and examine the vagina for signs of bacterial vaginosis. A sample of vaginal fluid may be taken. Your health care provider will look at this sample under a microscope to check for bacteria and abnormal cells. A vaginal pH test may also be done.  TREATMENT  Bacterial vaginosis may be treated with antibiotic medicines. These may be given in the form of a pill or a vaginal cream. A second round of antibiotics may be prescribed if the condition comes back after treatment.  HOME CARE INSTRUCTIONS   Only take over-the-counter or prescription medicines as  directed by your health care provider.  If antibiotic medicine was prescribed, take it as directed. Make sure you finish it even if you start to feel better.  Do not have sex until treatment is completed.  Tell all sexual partners that you have a vaginal infection. They should see their health care provider and be treated if they have problems, such as a mild rash or itching.  Practice safe sex by using condoms and only having one sex partner. SEEK MEDICAL CARE IF:   Your symptoms are not improving after 3 days of treatment.  You have increased discharge or pain.  You have a fever. MAKE SURE YOU:   Understand these instructions.  Will watch your condition.  Will get help right away if you are not doing well or get worse. FOR MORE INFORMATION  Centers for Disease Control and Prevention, Division of STD Prevention: Appraiser[employer]raud[employer]fi American Sexual Health Association (ASHA): www.ashastd.org  Document Released: 07/14/2005 Document Revised: 05/04/2013 Document Reviewed: 02/23/2013 [facility] Patient Information 2015 [place], [place]. This information is not intended to replace advice given to you by your health care provider. Make sure you discuss any questions you have with your health care provider. Sexually Transmitted Disease A sexually transmitted disease (STD) is a disease or infection that may be passed (transmitted) from person to person, usually during sexual activity. This may happen by way of saliva, semen, blood, vaginal mucus, or urine. Common STDs include:   Gonorrhea.   Chlamydia.   Syphilis.   HIV and AIDS.   Genital herpes.   Hepatitis B and C.   Trichomonas.   Human papillomavirus (HPV).   Pubic lice.   Scabies.  Mites.  Bacterial  vaginosis. WHAT ARE CAUSES OF STDs? An STD may be caused by bacteria, a virus, or parasites. STDs are often transmitted during sexual activity if one person is infected. However, they may also be transmitted through  nonsexual means. STDs may be transmitted after:   Sexual intercourse with an infected person.   Sharing sex toys with an infected person.   Sharing needles with an infected person or using unclean piercing or tattoo needles.  Having intimate contact with the genitals, mouth, or rectal areas of an infected person.   Exposure to infected fluids during birth. WHAT ARE THE SIGNS AND SYMPTOMS OF STDs? Different STDs have different symptoms. Some people may not have any symptoms. If symptoms are present, they may include:   Painful or bloody urination.   Pain in the pelvis, abdomen, vagina, anus, throat, or eyes.   A skin rash, itching, or irritation.  Growths, ulcerations, blisters, or sores in the genital and anal areas.  Abnormal vaginal discharge with or without bad odor.   Penile discharge in men.   Fever.   Pain or bleeding during sexual intercourse.   Swollen glands in the groin area.   Yellow skin and eyes (jaundice). This is seen with hepatitis.   Swollen testicles.  Infertility.  Sores and blisters in the mouth. HOW ARE STDs DIAGNOSED? To make a diagnosis, your health care provider may:   Take a medical history.   Perform a physical exam.   Take a sample of any discharge to examine.  Swab the throat, cervix, opening to the penis, rectum, or vagina for testing.  Test a sample of your first morning urine.   Perform blood tests.   Perform a Pap test, if this applies.   Perform a colposcopy.   Perform a laparoscopy.  HOW ARE STDs TREATED? Treatment depends on the STD. Some STDs may be treated but not cured.   Chlamydia, gonorrhea, trichomonas, and syphilis can be cured with antibiotic medicine.   Genital herpes, hepatitis, and HIV can be treated, but not cured, with prescribed medicines. The medicines lessen symptoms.   Genital warts from HPV can be treated with medicine or by freezing, burning (electrocautery), or surgery. Warts  may come back.   HPV cannot be cured with medicine or surgery. However, abnormal areas may be removed from the cervix, vagina, or vulva.   If your diagnosis is confirmed, your recent sexual partners need treatment. This is true even if they are symptom-free or have a negative culture or evaluation. They should not have sex until their health care providers say it is okay. HOW CAN I REDUCE MY RISK OF GETTING AN STD? Take these steps to reduce your risk of getting an STD:  Use latex condoms, dental dams, and water-soluble lubricants during sexual activity. Do not use petroleum jelly or oils.  Avoid having multiple sex partners.  Do not have sex with someone who has other sex partners.  Do not have sex with anyone you do not know or who is at high risk for an STD.  Avoid risky sex practices that can break your skin.  Do not have sex if you have open sores on your mouth or skin.  Avoid drinking too much alcohol or taking illegal drugs. Alcohol and drugs can affect your judgment and put you in a vulnerable position.  Avoid engaging in oral and anal sex acts.  Get vaccinated for HPV and hepatitis. If you have not received these vaccines in the past, talk to your health  care provider about whether one or both might be right for you.   If you are at risk of being infected with HIV, it is recommended that you take a prescription medicine daily to prevent HIV infection. This is called pre-exposure prophylaxis (PrEP). You are considered at risk if:  You are a man who has sex with other men (MSM).  You are a heterosexual man or woman and are sexually active with more than one partner.  You take drugs by injection.  You are sexually active with a partner who has HIV.  Talk with your health care provider about whether you are at high risk of being infected with HIV. If you choose to begin PrEP, you should first be tested for HIV. You should then be tested every 3 months for as long as you  are taking PrEP.  WHAT SHOULD I DO IF I THINK I HAVE AN STD?  See your health care provider.   Tell your sexual partner(s). They should be tested and treated for any STDs.  Do not have sex until your health care provider says it is okay. WHEN SHOULD I GET IMMEDIATE MEDICAL CARE? Contact your health care provider right away if:   You have severe abdominal pain.  You are a man and notice swelling or pain in your testicles.  You are a woman and notice swelling or pain in your vagina. Document Released: 10/04/2002 Document Revised: 07/19/2013 Document Reviewed: 02/01/2013 [facility] Patient Information 2015 [place], [place]. This information is not intended to replace advice given to you by your health care provider. Make sure you discuss any questions you have with your health care provider.

## 2014-05-05 NOTE — Progress Notes (Signed)
Reviewed personally.  M. Suzanne Hesston Hitchens, MD.  

## 2014-05-05 NOTE — Progress Notes (Signed)
53 y.o.divorced white female [id number] with complaint of vaginal symptoms of itching, burning, and increase discharge. Describes discharge as scant clear. Onset of symptoms 3 days ago. Denies new personal products or vaginal dryness. Patient had sexual activity without condoms and then noticed symptoms. Oral sexuality also. No history of HSV or partner with history of HSV.? STD concerns. Urinary symptoms none . Desires GC,chlamydia screening only. Contraception: Mirena IUD   O:Healthy female WDWN Affect: normal, orientation x 3  Exam: Abdomen: Lymph node: no enlargement or tenderness Pelvic exam: External genital: slight increase in pink, no lesions or blisters noted BUS: negative Vagina: grey odorous discharge noted. Ph:5.0   ,Wet prep taken Cervix: normal, non tender, IUD string noted Uterus: normal, non tender Adnexa:normal, non tender, no masses or fullness noted   Wet Prep results: positive for clue   A:BV STD screening Contraception Mirena IUD   P:Discussed findings of BV and etiology. Discussed Aveeno or baking soda sitz bath for comfort. Questions addressed. Discussed STD prevention and additional screening if positive. Patient aware of concerns with STD's. Rx: Metrogel see order given to patient per request Lab: GC,Chlalmydia  Rv prn

## 2014-05-09 LAB — IPS N GONORRHOEA AND CHLAMYDIA BY PCR

## 2014-05-10 ENCOUNTER — Telehealth: Payer: Self-pay | Admitting: Obstetrics & Gynecology

## 2014-05-10 NOTE — Telephone Encounter (Signed)
Have patient complete Metrogel course with twice daily for next 2 days and see if it resolves. It usually takes to destroy overgrowth of bacteria and another week for all to return to normal.

## 2014-05-10 NOTE — Telephone Encounter (Signed)
Spoke with patient. Patient states that she was seen last week with Danielle Kim CNM and was treated for BV. Patient completed metrogel yesterday and is still having "vaginal irritation." Patient denies discharge, odor, burning, and itching. "It just still feels irritated like it did before." Patient has been preforming sitz bath with mild relief. Patient states that she was seen last year with Dr.Miller "for something similar and I think that she gave me a pill that treated it right away." patient would like me to check to see what medication this was. Advised patient will pull chart to review previous office visits. Advised will send a message to Danielle Kim CNM as well and return call with further recommendations.   Reviewed patient's paper chart last time patient was seen for similar symptoms was 01/14/12 and was treated for BV and yeast with Metrogel and Diflucan. Danielle Kim CNM please advise if there is anything else patient may due at this time for relief.

## 2014-05-10 NOTE — Telephone Encounter (Signed)
Pt says she is calling joy back but i dont see a message at all. Pt said she is having problems from her appt still.

## 2014-05-11 MED ORDER — FLUCONAZOLE 150 MG PO TABS: 150.0000 mg | ORAL_TABLET | Freq: Once | ORAL | Status: DC

## 2014-05-11 MED ORDER — NYSTATIN 100000 UNIT/GM EX CREA: 1.0000 "application " | TOPICAL_CREAM | Freq: Two times a day (BID) | CUTANEOUS | Status: DC

## 2014-05-11 NOTE — Telephone Encounter (Signed)
Patient notified of Dr Ammie Ferrier response and agreeable to OV if these meds do not work.

## 2014-05-11 NOTE — Telephone Encounter (Signed)
RX done.  If still symptomatic, will need OV.  Rx for Nystatin cream for external use also given--use twice daily.

## 2014-05-11 NOTE — Telephone Encounter (Signed)
Patient calling back to see why Diflucan was not called in? She is very uncomfortable and is not able to sleep she is so uncomfortable. She spoke to [facility] yesterday and explained this is exactly the same as previous time when she was on both Diflucan and Metrogel and this made such a immediate improvement. Does not understand why we wont just call this in. Advised of Debbi's instruction for increased Metrogel to BID based on exam from 05-05-14. Need to give Metrogel more time to work, takes another week for bacterial to correct itself. Patient states she used Metrogel twice the first day and twice yesterday in effort to get relief and she is not getting any better. Again states she is so miserable she cant sleep and is concerned to be going into weekend. Really wants the Diflucan that was given to her with Metrogel previously. Advised will cehck with Debbi and let her know patient's concerns.   Routed to Dr Sabra Heck since Debbi has left the day.

## 2014-05-29 ENCOUNTER — Encounter: Payer: Self-pay | Admitting: Certified Nurse Midwife

## 2014-07-10 ENCOUNTER — Other Ambulatory Visit: Payer: Self-pay | Admitting: Obstetrics & Gynecology

## 2014-07-10 ENCOUNTER — Telehealth: Payer: Self-pay | Admitting: Obstetrics & Gynecology

## 2014-07-10 MED ORDER — NITROFURANTOIN MONOHYD MACRO 100 MG PO CAPS: 100.0000 mg | ORAL_CAPSULE | Freq: Two times a day (BID) | ORAL | Status: DC

## 2014-07-10 NOTE — Telephone Encounter (Signed)
Patient notified she said she appreciates it!

## 2014-07-10 NOTE — Telephone Encounter (Signed)
Rx for macrobid 100mg  bid x 7 days to [facility]

## 2014-07-10 NOTE — Telephone Encounter (Signed)
Called patient she states Dr.Miller prescribed her Macrobid sometime last year (11/24/12 #30/5 rfs was sent to [facility]). Patient says that she has had a cold for the past week and today she thinks she has a uti. She c/o urinary urgency and having a heaviness like she needs to pee since yesterday evening. She doesn't have any burning or pain. She said she tried to contact her PCP this morning but they don't have any openings. Offered her a appointment but she says she is not feeling well and feels like she has the flu, so she doesn't want to come in. I told patient that Dr. Sabra Heck was in surgery this morning and wouldn't be back till this afternoon and that I would send her a message in regards to her symptoms.  Please advise Dr. Sabra Heck.

## 2014-07-10 NOTE — Telephone Encounter (Signed)
Pt is requesting a refill for Macrobid( generic please) she states Dr. Sabra Heck has given her this before with refills but she is out now. Offered to schedule her an appointment for a UTI but patient declined and insists on speaking to [place] to get this prescription filled first.

## 2014-07-13 ENCOUNTER — Ambulatory Visit (INDEPENDENT_AMBULATORY_CARE_PROVIDER_SITE_OTHER): Payer: Commercial Managed Care - PPO | Admitting: Certified Nurse Midwife

## 2014-07-13 ENCOUNTER — Encounter: Payer: Self-pay | Admitting: Certified Nurse Midwife

## 2014-07-13 VITALS — BP 110/74 | HR 72 | Temp 98.2°F | Resp 16 | Ht 65.5 in | Wt 138.0 lb

## 2014-07-13 DIAGNOSIS — B3731 Acute candidiasis of vulva and vagina: Secondary | ICD-10-CM

## 2014-07-13 DIAGNOSIS — R319 Hematuria, unspecified: Secondary | ICD-10-CM

## 2014-07-13 DIAGNOSIS — N390 Urinary tract infection, site not specified: Secondary | ICD-10-CM

## 2014-07-13 DIAGNOSIS — B373 Candidiasis of vulva and vagina: Secondary | ICD-10-CM

## 2014-07-13 LAB — POCT URINALYSIS DIPSTICK
Bilirubin, UA: NEGATIVE
Glucose, UA: NEGATIVE
Ketones, UA: NEGATIVE
LEUKOCYTES UA: NEGATIVE
NITRITE UA: NEGATIVE
PROTEIN UA: NEGATIVE
Urobilinogen, UA: NEGATIVE
pH, UA: 8

## 2014-07-13 MED ORDER — PHENAZOPYRIDINE HCL 100 MG PO TABS: 100.0000 mg | ORAL_TABLET | Freq: Three times a day (TID) | ORAL | Status: DC | PRN

## 2014-07-13 MED ORDER — FLUCONAZOLE 150 MG PO TABS: ORAL_TABLET | ORAL | Status: DC

## 2014-07-13 NOTE — Progress Notes (Signed)
53 y.o. divorced white female [id number] here with complaint of pressure and frequency., with onset  5 days ago.Patient started herself on expired Macrobid and then received Rx of same and has only taken 2 because she now feels symptoms maybe vaginal. Patient had fever/chills with nausea 4 days ago and resolved after starting Macrobid   .Marland KitchenNo new personal products. Patient feels not related to sexual activity. Denies any vaginal symptoms except increase white discharge and  burning only. No vaginal dryness issues. Leaving to go out of town tomorrow for 10 days.  O: Healthy female WDWN Affect: Normal, orientation x 3 Skin : warm and dry CVAT: negative bilateral Abdomen: positive for suprapubic tenderness  Pelvic exam: External genital area: normal, no lesions Bladder,Urethra, Urethral meatus:all  tender Vagina: white clumpy nonodorous vaginal discharge, normal appearance  Wet prep taken, ph 4.5 Cervix: normal, non tender, IUD string noted Uterus:normal,non tender Adnexa: normal non tender, no fullness or masses   A: UTI incomplete treatment Yeast vaginitis  Poct urine-ph 8.0, rbc tr  P: Reviewed findings of UTI and need to complete medication she has. Macrobid bid  Rx Pyridium for frequency see order with instructions Reviewed findings of yeast vaginitis, which is causing some of the vaginal discomfort. Discussed Aveeno sitz prn for comfort. Rx Diflucan see order [id number] micro, culture Reviewed warning signs and symptoms of UTI and need to advise if occurs. Encouraged to limit soda, tea, and coffee RV 2 week if TOC needed for positive culture  RV prn

## 2014-07-13 NOTE — Patient Instructions (Signed)

## 2014-07-14 LAB — URINALYSIS, MICROSCOPIC ONLY
BACTERIA UA: NONE SEEN
CRYSTALS: NONE SEEN
Casts: NONE SEEN
Squamous Epithelial / LPF: NONE SEEN

## 2014-07-14 NOTE — Progress Notes (Signed)
Reviewed personally.  M. Suzanne Giah Fickett, MD.  

## 2014-07-15 LAB — URINE CULTURE: Colony Count: 5000

## 2014-08-21 ENCOUNTER — Other Ambulatory Visit: Payer: Self-pay | Admitting: Obstetrics & Gynecology

## 2014-08-21 DIAGNOSIS — Z1231 Encounter for screening mammogram for malignant neoplasm of breast: Secondary | ICD-10-CM

## 2014-08-23 ENCOUNTER — Ambulatory Visit (HOSPITAL_COMMUNITY)
Admission: RE | Admit: 2014-08-23 | Discharge: 2014-08-23 | Disposition: A | Discharge status: Home / Self Care | Payer: Commercial Managed Care - PPO | Source: Ambulatory Visit | Attending: Obstetrics & Gynecology | Admitting: Obstetrics & Gynecology

## 2014-08-23 DIAGNOSIS — Z1231 Encounter for screening mammogram for malignant neoplasm of breast: Secondary | ICD-10-CM | POA: Insufficient documentation

## 2014-08-29 ENCOUNTER — Telehealth: Payer: Self-pay | Admitting: Obstetrics & Gynecology

## 2014-08-29 NOTE — Telephone Encounter (Signed)
Patient is calling to see if her mammogram results are in yet.

## 2014-08-29 NOTE — Telephone Encounter (Signed)
No additional recommendations needed.  Encounter closed.  Thanks.

## 2014-08-29 NOTE — Telephone Encounter (Signed)
Patient had screening mammogram at [facility] on 08/23/2014. Results seen below. Results provided to patient. Patient is agreeable. Results available in EPIC for MD review if any further recommendations are needed for patient.  IMPRESSION: No mammographic evidence of malignancy. A result letter of this screening mammogram will be mailed directly to the patient.  RECOMMENDATION: Screening mammogram in one year. (Code:[id number])

## 2014-08-30 ENCOUNTER — Telehealth: Payer: Self-pay | Admitting: Obstetrics & Gynecology

## 2014-08-30 ENCOUNTER — Other Ambulatory Visit: Payer: Self-pay | Admitting: Obstetrics & Gynecology

## 2014-08-30 DIAGNOSIS — N951 Menopausal and female climacteric states: Secondary | ICD-10-CM

## 2014-08-30 NOTE — Telephone Encounter (Signed)
Pt really needs appt as well in case her hormones show menopause.  Then I will know what her desires are and we can discuss risks and benefits as well.  Any way you can put her on for an appt?  Lab orders are placed.

## 2014-08-30 NOTE — Telephone Encounter (Signed)
Spoke with patient. Patient states that she has been experiencing hot flashes and night sweats more frequently over the last couple of months. "I have just been adjusting with wearing light clothing. Now I have started to get more emotional and can cry at the drop of a hat. My sister says it could be related to menopause or my hormones." Patient is requesting to come in to have hormone levels checked. Appointment scheduled for tomorrow 2/4 at 4pm. Agreeable to date and time. Patient will need orders.  Dr.Miller, please enter lab orders for patient appointment on 2/4. Thank you!

## 2014-08-30 NOTE — Telephone Encounter (Signed)
Spoke with patient. Appointment scheduled for tomorrow at 3:30pm with Dr.Miller (date and time per Gay Filler).  Routing to provider for final review. Patient agreeable to disposition. Will close encounter

## 2014-08-30 NOTE — Telephone Encounter (Signed)
Patient is calling to talk with nurse.She thinks she may be in menopause.

## 2014-08-31 ENCOUNTER — Other Ambulatory Visit: Payer: Commercial Managed Care - PPO

## 2014-08-31 ENCOUNTER — Ambulatory Visit (INDEPENDENT_AMBULATORY_CARE_PROVIDER_SITE_OTHER): Payer: Commercial Managed Care - PPO | Admitting: Obstetrics & Gynecology

## 2014-08-31 VITALS — BP 128/84 | HR 68 | Resp 16 | Wt 143.6 lb

## 2014-08-31 DIAGNOSIS — N951 Menopausal and female climacteric states: Secondary | ICD-10-CM

## 2014-08-31 DIAGNOSIS — R4586 Emotional lability: Secondary | ICD-10-CM

## 2014-08-31 MED ORDER — SERTRALINE HCL 50 MG PO TABS: 50.0000 mg | ORAL_TABLET | Freq: Every day | ORAL | Status: DC

## 2014-08-31 NOTE — Progress Notes (Signed)
Patient ID: Danielle Kim, female   DOB: 1961/01/23, 54 y.o.   MRN: [id number]  54 yo G3P3 here for discussion of possible menopausal symptoms.  Pt reports she is having hot flashes and night sweats which feel very "menopausal" to her.  Her biggest concern/issue is feeling a lot more moody.  She sometimes feels like she can't control her reactions to situations.  Pt is divorced but still has to deal with ex-husband and his drug issues.  This is very hard on her children and she is often the intermediary which is stressful.  She wants to be really stable for her kids and just doesn't know what is the best way to address this.  Called to see if labs could be done but I felt it best for [place] to have a discussion as well so we know what the plan is, once the labs results are back.  HRT with risks and benefits as well as SSRI use discussed.  Pt has been on Zoloft in the past.  Not sure she "needs" this.  Discussed with pt the common ways emotional lability, especially perimenopausally, is addressed.  She really doesn't want to start HRT if possible.  Was on Zoloft 75mg  in the past.  Will start with a lower dosage and increase if needed.  Pt very comfortable with this plan. Serotonin syndrome discussed.   Pt's risks are low as she was on this same medication in the pats and is on very little else.  Medication list reviewed with pt as well.    Assessment:  Menopausal symptoms Emotional lability  Plan:  FSH and estradiol level Zoloft 50mg  daily.  Rx to pharmacy. Pt is to call and give me an update in 2-4 weeks.  She is aware dosage can be easily adjusted.  All questions answered.  ~15 minutes spent with patient >50% of time was in face to face discussion of above.

## 2014-09-01 LAB — ESTRADIOL: ESTRADIOL: 16.1 pg/mL

## 2014-09-01 LAB — FOLLICLE STIMULATING HORMONE: FSH: 144.1 m[IU]/mL — ABNORMAL HIGH

## 2014-09-05 ENCOUNTER — Telehealth: Payer: Self-pay

## 2014-09-05 NOTE — Telephone Encounter (Signed)
-----   Message from Lyman Speller, MD sent at 09/05/2014  7:23 AM EST ----- Please let pt know she is in full menopause range.  She needs to give me and update about her medication in a few weeks.

## 2014-09-05 NOTE — Telephone Encounter (Signed)
Patient notified of result. See result note.//kn

## 2014-09-05 NOTE — Telephone Encounter (Signed)
Lmtcb//kn 

## 2014-09-08 ENCOUNTER — Encounter: Payer: Self-pay | Admitting: Obstetrics & Gynecology

## 2014-10-12 ENCOUNTER — Telehealth: Payer: Self-pay | Admitting: Obstetrics & Gynecology

## 2014-10-12 NOTE — Telephone Encounter (Signed)
Spoke with patient. Patient is calling to provide Dr.Miller with an update on how she is doing with her medication. "I am so much better. I only took it for a couple of days before I cut it down from 50mg  to 25mg  because it was trashing my gut. Then I took it for two weeks and came off of it. I think it was mainly the weather and it being winter. I am doing great now and I am not depressed at all."  Advised patient would provide Dr.Miller with an update. Patient is agreeable. Patient requesting recommendation for a hand specialist in the [place] area. "I have trigger finger and it is causing me to have a huge knot on my palm. I really trust her and would love her recommendation on who to see." Advised I will speak with Dr.Miller and return call with further recommendations. Patient is agreeable.

## 2014-10-12 NOTE — Telephone Encounter (Signed)
Notes Recorded by Maryann Conners, CMA on 09/05/2014 at 4:24 PM Patient notified of all results. Will call back in 3 weeks with update.//kn Notes Recorded by Maryann Conners, CMA on 09/05/2014 at 4:16 PM Left message to return call. Notes Recorded by Lyman Speller, MD on 09/05/2014 at 7:23 AM Please let pt know she is in full menopause range. She needs to give me and update about her medication in a few weeks.

## 2014-10-12 NOTE — Telephone Encounter (Signed)
Patient states she is calling back [place] no message found.

## 2014-10-13 NOTE — Telephone Encounter (Signed)
Spoke with patient. Advised of message as seen below from [place]. Patient is agreeable and will call to schedule an appointment.  Routing to provider for final review. Patient agreeable to disposition. Will close encounter

## 2014-10-13 NOTE — Telephone Encounter (Signed)
Dr. Amedeo Plenty at [facility] is a hand specialist and someone I would see.  Just a suggestion though.

## 2014-10-13 NOTE — Telephone Encounter (Signed)
Left message to call Kaitlyn at [phone]. 

## 2014-10-24 ENCOUNTER — Other Ambulatory Visit (HOSPITAL_COMMUNITY): Payer: Self-pay | Admitting: Family Medicine

## 2014-10-24 DIAGNOSIS — Z1231 Encounter for screening mammogram for malignant neoplasm of breast: Secondary | ICD-10-CM

## 2015-04-23 ENCOUNTER — Telehealth: Payer: Self-pay | Admitting: Obstetrics & Gynecology

## 2015-04-23 NOTE — Telephone Encounter (Signed)
Patient has an IUD and has not had a cycle in "a while". Patient is asking if this is normal? Last seen 08/31/14.

## 2015-04-23 NOTE — Telephone Encounter (Signed)
Left message to call Kaitlyn at [phone]. 

## 2015-04-24 NOTE — Telephone Encounter (Signed)
Spoke with patient. Patient states that she has not had a cycle in over a year. Has a Mirena IUD in place. IUD was placed 12/12. Labs in 08/2014 indicated menopause (please see results note below). Patient states that yesterday she had light spotting and an "uneasy" feeling in her stomach. Denies any pain, discomfort, or heavy bleeding. Has not had any bleeding today. Advised she will need to be seen in office for further evaluation. Patient is agreeable. Aware Dr.Miller is out of the office this week and prefers to wait to see her. Appointment scheduled for 05/02/2015 at 10:15 am with Dr.Miller. Agreeable to date and time. Will return call if she develops new symptoms or bleeding increases to see another Jake Goodson this week.  Notes Recorded by Lyman Speller, MD on 09/05/2014 at 7:23 AM Please let pt know she is in full menopause range. She needs to give me and update about her medication in a few weeks.  Routing to [place] for final review as Dr.Miller is out of the office this week.Will close encounter.  Cc: Dr.Miller

## 2015-05-02 ENCOUNTER — Ambulatory Visit (INDEPENDENT_AMBULATORY_CARE_PROVIDER_SITE_OTHER): Payer: Commercial Managed Care - PPO | Admitting: Obstetrics & Gynecology

## 2015-05-02 ENCOUNTER — Encounter: Payer: Self-pay | Admitting: Obstetrics & Gynecology

## 2015-05-02 VITALS — BP 118/80 | HR 84 | Resp 16 | Ht 65.5 in | Wt 145.0 lb

## 2015-05-02 DIAGNOSIS — N950 Postmenopausal bleeding: Secondary | ICD-10-CM

## 2015-05-02 NOTE — Progress Notes (Signed)
Subjective:     Patient ID: Danielle Kim, female   DOB: 03/06/1961, 54 y.o.   MRN: [id number]  HPI 71 G3P3 DWF here for complaint of vaginal bleeding.  Pt reports having vaginal bleeding about two weekends ago.  She had a little bloating that proceeded the bleeding.  This happened again about a week later.  She also had a little menstrual cramping as well.  Pt has a Mirena IUD that was placed 12/12.  Pt is sexually active and has been with her significant other about 15 months.  No STD concerns.  Denies vaginal odor or discharge except for the bleeding.  Denies pain with intercourse.    [place] 2/16 was 144.     Review of Systems  All other systems reviewed and are negative.      Objective:   Physical Exam  Constitutional: She appears well-developed and well-nourished.  Abdominal: Soft. Bowel sounds are normal. She exhibits no distension and no mass. There is no tenderness. There is no rebound and no guarding.  Genitourinary: Vagina normal and uterus normal. There is no rash, tenderness or lesion on the right labia. There is no rash, tenderness or lesion on the left labia. Cervix exhibits motion tenderness and discharge. Right adnexum displays no mass, no tenderness and no fullness. Left adnexum displays no mass, no tenderness and no fullness. No bleeding in the vagina.  Lymphadenopathy:       Right: No inguinal adenopathy present.       Left: No inguinal adenopathy present.  Skin: Skin is warm and dry.  Psychiatric: She has a normal mood and affect.   Endometrial biopsy recommended.  Discussed with patient.  Verbal and written consent obtained.   Procedure:  Speculum placed.  Cervix visualized and cleansed with betadine prep.  A single toothed tenaculum was applied to the anterior lip of the cervix.  Endometrial pipelle was advanced through the cervix into the endometrial cavity without difficulty.  Pipelle passed to 7.5cm.  Suction applied and pipelle removed with good tissue sample  obtained.  Tenculum removed.  No bleeding noted.  Patient tolerated procedure well.     Assessment:     PMP bleeding     Plan:     Endometrial biopsy pending.  Results will be called to pt. Anton Ruiz today.

## 2015-05-03 LAB — FOLLICLE STIMULATING HORMONE: FSH: 175.8 m[IU]/mL — ABNORMAL HIGH

## 2015-05-10 ENCOUNTER — Telehealth: Payer: Self-pay | Admitting: Emergency Medicine

## 2015-05-10 NOTE — Telephone Encounter (Signed)
Message left to return call to Crista Nuon at [phone].    

## 2015-05-10 NOTE — Telephone Encounter (Signed)
Patient given message from Dr. Sabra Heck.  Has annual exam tomorrow with Dr. Sabra Heck  and will follow up as scheduled.

## 2015-05-10 NOTE — Telephone Encounter (Signed)
-----   Message from Megan Salon, MD sent at 05/07/2015  4:53 PM EDT ----- Please inform endometrial biopsy was negative for abnormal cells.  It showed "inactive endometrium" which is what it should be.  Her FSH was still elevated, as it should be.  Nothing else is needed.  She should call if has additional bleeding.  Would proceed with PUS then.

## 2015-05-11 ENCOUNTER — Encounter: Payer: Self-pay | Admitting: Obstetrics & Gynecology

## 2015-05-11 ENCOUNTER — Ambulatory Visit (INDEPENDENT_AMBULATORY_CARE_PROVIDER_SITE_OTHER): Payer: Commercial Managed Care - PPO | Admitting: Obstetrics & Gynecology

## 2015-05-11 VITALS — BP 120/82 | HR 64 | Resp 16 | Ht 66.5 in | Wt 145.0 lb

## 2015-05-11 DIAGNOSIS — Z01419 Encounter for gynecological examination (general) (routine) without abnormal findings: Secondary | ICD-10-CM

## 2015-05-11 NOTE — Progress Notes (Signed)
54 y.o. G3P3 DivorcedCaucasianF here for annual exam.  Doing well.  Reviewed pathology from biopsy and FSH.   Now has two Bertram values in the 100+ range.  Recommended removal of IUD.  PCP:  Dr. Hilma Favors.  Labs done in June.    Patient's last menstrual period was 04/28/2015.          Sexually active: Yes.    The current method of family planning is IUD.    Exercising: Yes.    walking Smoker:  Former smoker  Health Maintenance: Pap:  03/31/14 WNL History of abnormal Pap:  yes MMG:  08/23/14 BiRads 1 negative Colonoscopy:  2/15-repeat in 10 years BMD:   none TDaP:  2011 Screening Labs: PCP, Hb today: PCP, Urine today: PCP   reports that she has quit smoking. Her smoking use included Cigarettes. She has never used smokeless tobacco. She reports that she drinks about 2.4 oz of alcohol per week. She reports that she does not use illicit drugs.  Past Medical History  Diagnosis Date  . Cluster headaches   . Anxiety   . Other specified glaucoma   . Pain around left eye      several bouts  sicne 2011.     Past Surgical History  Procedure Laterality Date  . Cervical disc surgery      2001  . Augmentation mammaplasty  2010    Current Outpatient Prescriptions  Medication Sig Dispense Refill  . DiphenhydrAMINE HCl (BENADRYL ALLERGY PO) Take by mouth as needed. Or will take Nyquil-for sleep    . ibuprofen (ADVIL,MOTRIN) 200 MG tablet Take 200 mg by mouth every 6 (six) hours as needed for pain. Pt only takes as needed which is not often    . Multiple Vitamin (MULTIVITAMIN) tablet Take 1 tablet by mouth daily.    . timolol (BETIMOL) 0.25 % ophthalmic solution 1-2 drops 2 (two) times daily.    . Travoprost (TRAVATAN OP) Apply to eye. Left eye one drop at night.    . traZODone (DESYREL) 50 MG tablet Take 1 tablet (50 mg total) by mouth at bedtime as needed. 30 tablet 5   No current facility-administered medications for this visit.    Family History  Problem Relation Age of Onset  . Heart  attack Father   . Diabetes Father   . Stroke Mother   . Heart attack Mother   . Stomach cancer Maternal Aunt   . Colon cancer Neg Hx     ROS:  Pertinent items are noted in HPI.  Otherwise, a comprehensive ROS was negative.  Exam:   BP 120/82 mmHg  Pulse 64  Resp 16  Ht 5' 6.5" (1.689 m)  Wt 145 lb (65.772 kg)  BMI 23.06 kg/m2  LMP 04/28/2015  Weight change: -1#   Height: 5' 6.5" (168.9 cm) (with shoes)  Ht Readings from Last 3 Encounters:  05/11/15 5' 6.5" (1.689 m)  05/02/15 5' 5.5" (1.664 m)  07/13/14 5' 5.5" (1.664 m)    General appearance: alert, cooperative and appears stated age Head: Normocephalic, without obvious abnormality, atraumatic Neck: no adenopathy, supple, symmetrical, trachea midline and thyroid normal to inspection and palpation Lungs: clear to auscultation bilaterally Breasts: normal appearance, no masses or tenderness Heart: regular rate and rhythm Abdomen: soft, non-tender; bowel sounds normal; no masses,  no organomegaly Extremities: extremities normal, atraumatic, no cyanosis or edema Skin: Skin color, texture, turgor normal. No rashes or lesions Lymph nodes: Cervical, supraclavicular, and axillary nodes normal. No abnormal inguinal nodes palpated  Neurologic: Grossly normal   Pelvic: External genitalia:  no lesions              Urethra:  normal appearing urethra with no masses, tenderness or lesions              Bartholins and Skenes: normal                 Vagina: normal appearing vagina with normal color and discharge, no lesions              Cervix: no lesions, IUD string noted              Pap taken: No. Bimanual Exam:  Uterus:  normal size, contour, position, consistency, mobility, non-tender              Adnexa: normal adnexa and no mass, fullness, tenderness               Rectovaginal: Confirms               Anus:  normal sphincter tone, no lesions  When speculum was in place, IUD string noted and grasped with ringed forceps.  This was  removed with one pull.  Pt tolerated procedure well.    Chaperone was present for exam.  A:  Well Woman with normal exam  Has Mirena IUD 12/1.   H/O depression  Mild insomnia Smoker   P: Mammogram yearly.  pap smear with neg HR HPV 2013. Pap 2015.  No pap today Labs with PCP this year. No rx for Trazodone needed. IUD removed today. Pt aware needs to call with any bleeding. return annually or prn

## 2015-09-12 ENCOUNTER — Encounter: Payer: Self-pay | Admitting: Obstetrics and Gynecology

## 2015-09-12 ENCOUNTER — Ambulatory Visit (INDEPENDENT_AMBULATORY_CARE_PROVIDER_SITE_OTHER): Payer: Commercial Managed Care - PPO | Admitting: Obstetrics and Gynecology

## 2015-09-12 VITALS — BP 110/70 | HR 64 | Resp 14 | Wt 147.0 lb

## 2015-09-12 DIAGNOSIS — N949 Unspecified condition associated with female genital organs and menstrual cycle: Secondary | ICD-10-CM | POA: Diagnosis not present

## 2015-09-12 DIAGNOSIS — N9089 Other specified noninflammatory disorders of vulva and perineum: Secondary | ICD-10-CM

## 2015-09-12 DIAGNOSIS — N898 Other specified noninflammatory disorders of vagina: Secondary | ICD-10-CM | POA: Diagnosis not present

## 2015-09-12 LAB — POCT URINALYSIS DIPSTICK
Bilirubin, UA: NEGATIVE
Glucose, UA: NEGATIVE
Ketones, UA: NEGATIVE
LEUKOCYTES UA: NEGATIVE
NITRITE UA: NEGATIVE
PH UA: 6.5
PROTEIN UA: NEGATIVE
RBC UA: NEGATIVE
Urobilinogen, UA: NEGATIVE

## 2015-09-12 NOTE — Progress Notes (Signed)
Patient ID: Danielle Kim, female   DOB: 1961/06/19, 55 y.o.   MRN: [id number] GYNECOLOGY  VISIT   HPI: 55 y.o.   Divorced  Caucasian  female   G5P3 PMP female here c/o vaginal irritation for the last 3 weeks. She is only sexually active on the weekends, doesn't live with her boyfriend. She has a mild vulvar irritation, intermittently, worse in the last 2 weeks. Her partner has a h/o genital warts, she doesn't notice any lumps. No itching, no burning. No vaginal d/c, no odor, no vaginal bleeding.  GYNECOLOGIC HISTORY: Post menopausal Contraception:NA Menopausal hormone therapy: none        OB History    Gravida Para Term Preterm AB TAB SAB Ectopic Multiple Living   3 3        3          Patient Active Problem List   Diagnosis Date Noted  . Insomnia 03/31/2014  . Pain around left eye     Past Medical History  Diagnosis Date  . Cluster headaches   . Anxiety   . Other specified glaucoma   . Pain around left eye      several bouts  sicne 2011.     Past Surgical History  Procedure Laterality Date  . Cervical disc surgery      2001  . Augmentation mammaplasty  2010    Current Outpatient Prescriptions  Medication Sig Dispense Refill  . Fish Oil-Cholecalciferol (FISH OIL + D3 PO) Take by mouth.    Marland Kitchen ibuprofen (ADVIL,MOTRIN) 200 MG tablet Take 200 mg by mouth every 6 (six) hours as needed for pain. Pt only takes as needed which is not often    . timolol (BETIMOL) 0.25 % ophthalmic solution 1-2 drops 2 (two) times daily.    . Travoprost (TRAVATAN OP) Apply to eye. Left eye one drop at night.    . traZODone (DESYREL) 50 MG tablet Take 1 tablet (50 mg total) by mouth at bedtime as needed. 30 tablet 5   No current facility-administered medications for this visit.     ALLERGIES: Review of patient's allergies indicates no known allergies.  Family History  Problem Relation Age of Onset  . Heart attack Father   . Diabetes Father   . Stroke Mother   . Heart attack Mother   .  Stomach cancer Maternal Aunt   . Colon cancer Neg Hx     Social History   Social History  . Marital Status: Divorced    Spouse Name: N/A  . Number of Children: 3  . Years of Education: 15   Occupational History  .     Social History Main Topics  . Smoking status: Former Smoker    Types: Cigarettes  . Smokeless tobacco: Never Used     Comment: quit 9 months ago  . Alcohol Use: 2.4 oz/week    4 Standard drinks or equivalent per week  . Drug Use: No  . Sexual Activity:    Partners: Male    Birth Control/ Protection: IUD   Other Topics Concern  . Not on file   Social History Narrative   Patient is a single mother. Patient works for [employer]. Patient has college education.   Caffeine- three cups coffee daily.   Left handed.    Review of Systems  Constitutional: Negative.   HENT: Negative.   Eyes: Negative.   Respiratory: Negative.   Cardiovascular: Negative.   Gastrointestinal: Negative.   Genitourinary:  Vaginal irritation   Musculoskeletal: Negative.   Skin: Negative.   Neurological: Negative.   Endo/Heme/Allergies: Negative.   Psychiatric/Behavioral: Negative.     PHYSICAL EXAMINATION:    BP 110/70 mmHg  Pulse 64  Resp 14  Wt 147 lb (66.679 kg)    General appearance: alert, cooperative and appears stated age  Pelvic: External genitalia:  She has a small papule on the right labia majora, ? Skin tag vs condyloma              Urethra:  normal appearing urethra with no masses, tenderness or lesions              Bartholins and Skenes: normal                 Vagina: normal appearing vagina with an increase in watery, frothy, white vaginal d/c              Cervix:no lesions  Chaperone was present for exam.  Wet prep: ? clue, no trich, few wbc KOH: no yeast PH: 4   ASSESSMENT Vulvar/vaginal irritation Skin tag vs condyloma on the right labia majora. Partner with genital warts    PLAN Send wet prep probe, suspect she has BV and  will need treatment Can use Vaseline externally  Discussed vulvar skin care She would like the lump on her vulva removed, she will schedule with Dr Sabra Heck   An After Visit Summary was printed and given to the patient.  15 minutes face to face time of which over 50% was spent in counseling.

## 2015-09-13 ENCOUNTER — Other Ambulatory Visit: Payer: Self-pay | Admitting: *Deleted

## 2015-09-13 ENCOUNTER — Telehealth: Payer: Self-pay | Admitting: *Deleted

## 2015-09-13 LAB — WET PREP BY MOLECULAR PROBE
Candida species: NEGATIVE
GARDNERELLA VAGINALIS: NEGATIVE
TRICHOMONAS VAG: NEGATIVE

## 2015-09-13 MED ORDER — BETAMETHASONE VALERATE 0.1 % EX OINT: TOPICAL_OINTMENT | CUTANEOUS | Status: DC

## 2015-09-13 NOTE — Telephone Encounter (Signed)
-----   Message from Danielle Dom, MD sent at 09/13/2015  9:24 AM EST ----- Please advise the patient of normal results. Tell her to use the Vaseline externally as we discussed, call if her symptoms aren't improving.

## 2015-09-13 NOTE — Telephone Encounter (Signed)
09-13-15 [place] in regards to labs -eh

## 2015-09-13 NOTE — Telephone Encounter (Signed)
Patient following up on phone call from this morning.

## 2015-09-17 NOTE — Telephone Encounter (Signed)
I spoke with patient on Thursday 09-13-15- see result note -eh

## 2015-10-24 ENCOUNTER — Other Ambulatory Visit: Payer: Self-pay | Admitting: Obstetrics & Gynecology

## 2015-10-24 ENCOUNTER — Ambulatory Visit (HOSPITAL_COMMUNITY)
Admission: RE | Admit: 2015-10-24 | Discharge: 2015-10-24 | Disposition: A | Discharge status: Home / Self Care | Payer: Commercial Managed Care - PPO | Source: Ambulatory Visit | Attending: Obstetrics & Gynecology | Admitting: Obstetrics & Gynecology

## 2015-10-24 DIAGNOSIS — Z1231 Encounter for screening mammogram for malignant neoplasm of breast: Secondary | ICD-10-CM | POA: Diagnosis not present

## 2015-11-07 DIAGNOSIS — M65331 Trigger finger, right middle finger: Secondary | ICD-10-CM | POA: Insufficient documentation

## 2016-06-17 ENCOUNTER — Other Ambulatory Visit: Payer: Self-pay | Admitting: Obstetrics & Gynecology

## 2016-06-17 NOTE — Telephone Encounter (Signed)
Medication refill request: Trazodone  Last AEX:  05-11-15  Next AEX: 09-05-16  Last MMG (if hormonal medication request): 10-25-15 WNL  Refill authorized: please advise

## 2016-09-05 ENCOUNTER — Ambulatory Visit: Payer: Commercial Managed Care - PPO | Admitting: Obstetrics & Gynecology

## 2016-12-05 ENCOUNTER — Ambulatory Visit: Payer: Commercial Managed Care - PPO | Admitting: Obstetrics & Gynecology

## 2017-02-06 ENCOUNTER — Telehealth: Payer: Self-pay | Admitting: Obstetrics & Gynecology

## 2017-02-06 NOTE — Telephone Encounter (Signed)
Spoke with patient. Reports "BB" sized lump on right labia, sore to touch, red, no open area. Noticed on 02/01/17.Denies drainage or heat. Unsure if pimple or possibly ingrown hair. Has been applying neosporin and squeezing area. Was exposed to poison ok on extremities 2 times recently, unsure if related. Advised would need OV for further evaluation, advised Dr. Sabra Heck will be out of the office next week, can schedule with covering provider. Patient declined to schedule at this time, would like to continue to monitor and return call on Monday if unresolved. Patient  aware AEX due with Dr. Sabra Heck, will return to call when she has her schedule with her. Patient verbalizes understanding and is agreeable.   Routing to provider for final review. Patient is agreeable to disposition. Will close encounter.   Cc: Dr. Sabra Heck

## 2017-02-06 NOTE — Telephone Encounter (Signed)
Patient has developed a small sore and would like Dr.Miller look at it.

## 2017-02-11 ENCOUNTER — Ambulatory Visit (INDEPENDENT_AMBULATORY_CARE_PROVIDER_SITE_OTHER): Payer: Commercial Managed Care - PPO | Admitting: Certified Nurse Midwife

## 2017-02-11 ENCOUNTER — Encounter: Payer: Self-pay | Admitting: Certified Nurse Midwife

## 2017-02-11 VITALS — BP 104/68 | HR 70 | Resp 16 | Ht 66.5 in | Wt 149.0 lb

## 2017-02-11 DIAGNOSIS — N952 Postmenopausal atrophic vaginitis: Secondary | ICD-10-CM

## 2017-02-11 DIAGNOSIS — N898 Other specified noninflammatory disorders of vagina: Secondary | ICD-10-CM | POA: Diagnosis not present

## 2017-02-11 DIAGNOSIS — T7840XA Allergy, unspecified, initial encounter: Secondary | ICD-10-CM | POA: Diagnosis not present

## 2017-02-11 NOTE — Patient Instructions (Signed)
Atrophic Vaginitis Atrophic vaginitis is when the tissues that line the vagina become dry and thin. This is caused by a drop in estrogen. Estrogen helps:  To keep the vagina moist.  To make a clear fluid that helps: ? To lubricate the vagina for sex. ? To protect the vagina from infection.  If the lining of the vagina is dry and thin, it may:  Make sex painful. It may also cause bleeding.  Cause a feeling of: ? Burning. ? Irritation. ? Itchiness.  Make an exam of your vagina painful. It may also cause bleeding.  Make you lose interest in sex.  Cause a burning feeling when you pee.  Make your vaginal fluid (discharge) brown or yellow.  For some women, there are no symptoms. This condition is most common in women who do not get their regular menstrual periods anymore (menopause). This often starts when a woman is 45-55 years old. Follow these instructions at home:  Take medicines only as told by your doctor. Do not use any herbal or alternative medicines unless your doctor says it is okay.  Use over-the-counter products for dryness only as told by your doctor. These include: ? Creams. ? Lubricants. ? Moisturizers.  Do not douche.  Do not use products that can make your vagina dry. These include: ? Scented feminine sprays. ? Scented tampons. ? Scented soaps.  If it hurts to have sex, tell your sexual partner. Contact a doctor if:  Your discharge looks different than normal.  Your vagina has an unusual smell.  You have new symptoms.  Your symptoms do not get better with treatment.  Your symptoms get worse. This information is not intended to replace advice given to you by your health care provider. Make sure you discuss any questions you have with your health care provider. Document Released: 12/31/2007 Document Revised: 12/20/2015 Document Reviewed: 07/05/2014 Elsevier Interactive Patient Education  2018 Elsevier Inc.  

## 2017-02-11 NOTE — Progress Notes (Signed)
56 y.o. Divorced Caucasian female G3P3 here with complaint of rash on vulva area for 4 days. Noted sore on right vulva and treated with triple antibiotic ointment for the past 2 days and then noted itching around area. Feels like she has had reaction to medication. No change in vaginal  discharge. No urinary symptoms or new personal products. Feels the sore actually feels better and is resolved now. No other health issues today. Some vaginal dryness using OTC lubricant not working well, No other health issues today.  ROS pertinent to above  O:Healthy female WDWN Affect: normal, orientation x 3  Exam: Skin: warm and dry Abdomen:non tender, soft Inguinal Lymph nodes: no enlargement or tenderness Pelvic exam: External genital: normal female, right vulva, no sores or lesions noted, fine macular rash noted on vulva bilateral, no weeping appears to be resolving BUS: negative Vagina: scant discharge noted. Ph:4.0   ,Wet prep taken,  Cervix: normal, non tender, no CMT Uterus: normal, non tender Adnexa:normal, non tender, no masses or fullness noted   Wet Prep results: Koh, Saline, occasional WBC, negative for pathogens   A:Normal pelvic exam ? Ingrown hair from shaving Medication reaction to Triple antibiotic ointment   P:Discussed findings of normal pelvic exam and probable medicine allergy noted. . Discussed Aveeno or sitz bath for comfort. May use Aveeno Eczema cream for itching sparingly. Avoid moist clothes  for extended period of time. If working out in gym clothes or swim suits for long periods of time change underwear or bottoms of swimsuit if possible. Coconut Oil use for skin protection prior to activity can be used to external skin for protection and vaginal dryness. Questions addressed.   Rv prn

## 2017-03-10 ENCOUNTER — Encounter: Payer: Self-pay | Admitting: Obstetrics & Gynecology

## 2017-03-10 ENCOUNTER — Ambulatory Visit: Payer: Commercial Managed Care - PPO | Admitting: Obstetrics & Gynecology

## 2017-03-10 NOTE — Progress Notes (Deleted)
56 y.o. G3P3 Divorced Caucasian F here for annual exam.    Patient's last menstrual period was 04/28/2015.          Sexually active: {yes no:314532}  The current method of family planning is IUD.    Exercising: {yes DH:741638}  {types:19826} Smoker:  Former smoker  Health Maintenance: Pap:  03/31/14 negative  History of abnormal Pap:  yes MMG:  10/24/15 BIRADS 1 negative   Colonoscopy:  09/04/13- repeat 10 years  BMD:   *** TDaP:  2011  Pneumonia vaccine(s):  *** Zostavax:   *** Hep C testing: *** Screening Labs: ***, Hb today: ***, Urine today: ***   reports that she has quit smoking. Her smoking use included Cigarettes. She has never used smokeless tobacco. She reports that she drinks about 1.2 oz of alcohol per week . She reports that she does not use drugs.  Past Medical History:  Diagnosis Date  . Anxiety   . Cluster headaches   . Other specified glaucoma   . Pain around left eye     several bouts  sicne 2011.     Past Surgical History:  Procedure Laterality Date  . AUGMENTATION MAMMAPLASTY  2010  . [place] SURGERY     2001  . iud removed      Current Outpatient Prescriptions  Medication Sig Dispense Refill  . ibuprofen (ADVIL,MOTRIN) 200 MG tablet Take 200 mg by mouth every 6 (six) hours as needed for pain. Pt only takes as needed which is not often    . timolol (BETIMOL) 0.25 % ophthalmic solution 1-2 drops 2 (two) times daily.    . TRAVATAN Z 0.004 % SOLN ophthalmic solution PLACE 1 DROP INTO THE LEFT EYE NIGHTLY  5   No current facility-administered medications for this visit.     Family History  Problem Relation Age of Onset  . Heart attack Father   . Diabetes Father   . Stroke Mother   . Heart attack Mother   . Stomach cancer Maternal Aunt   . Colon cancer Neg Hx     ROS:  Pertinent items are noted in HPI.  Otherwise, a comprehensive ROS was negative.  Exam:   LMP 04/28/2015   Weight change: @WEIGHTCHANGE @ Height:      Ht Readings from Last 3  Encounters:  02/11/17 5' 6.5" (1.689 m)  05/11/15 5' 6.5" (1.689 m)  05/02/15 5' 5.5" (1.664 m)    General appearance: alert, cooperative and appears stated age Head: Normocephalic, without obvious abnormality, atraumatic Neck: no adenopathy, supple, symmetrical, trachea midline and thyroid {EXAM; THYROID:18604} Lungs: clear to auscultation bilaterally Breasts: {Exam; breast:13139::"normal appearance, no masses or tenderness"} Heart: regular rate and rhythm Abdomen: soft, non-tender; bowel sounds normal; no masses,  no organomegaly Extremities: extremities normal, atraumatic, no cyanosis or edema Skin: Skin color, texture, turgor normal. No rashes or lesions Lymph nodes: Cervical, supraclavicular, and axillary nodes normal. No abnormal inguinal nodes palpated Neurologic: Grossly normal   Pelvic: External genitalia:  no lesions              Urethra:  normal appearing urethra with no masses, tenderness or lesions              Bartholins and Skenes: normal                 Vagina: normal appearing vagina with normal color and discharge, no lesions              Cervix: {exam; cervix:14595}  Pap taken: {yes no:314532} Bimanual Exam:  Uterus:  {exam; uterus:12215}              Adnexa: {exam; adnexa:12223}               Rectovaginal: Confirms               Anus:  normal sphincter tone, no lesions  Chaperone was present for exam.  A:  Well Woman with normal exam  P:   {plan; gyn:5269::"mammogram","pap smear","return annually or prn"}

## 2017-03-16 ENCOUNTER — Other Ambulatory Visit (HOSPITAL_COMMUNITY)
Admission: RE | Admit: 2017-03-16 | Discharge: 2017-03-16 | Disposition: A | Discharge status: Home / Self Care | Payer: Commercial Managed Care - PPO | Source: Ambulatory Visit | Attending: Obstetrics & Gynecology | Admitting: Obstetrics & Gynecology

## 2017-03-16 ENCOUNTER — Ambulatory Visit (INDEPENDENT_AMBULATORY_CARE_PROVIDER_SITE_OTHER): Payer: Commercial Managed Care - PPO | Admitting: Obstetrics & Gynecology

## 2017-03-16 ENCOUNTER — Encounter: Payer: Self-pay | Admitting: Obstetrics & Gynecology

## 2017-03-16 VITALS — BP 118/76 | HR 58 | Resp 14 | Ht 65.5 in | Wt 150.0 lb

## 2017-03-16 DIAGNOSIS — Z205 Contact with and (suspected) exposure to viral hepatitis: Secondary | ICD-10-CM | POA: Diagnosis not present

## 2017-03-16 DIAGNOSIS — Z0000 Encounter for general adult medical examination without abnormal findings: Secondary | ICD-10-CM | POA: Diagnosis not present

## 2017-03-16 DIAGNOSIS — Z124 Encounter for screening for malignant neoplasm of cervix: Secondary | ICD-10-CM | POA: Insufficient documentation

## 2017-03-16 DIAGNOSIS — D72829 Elevated white blood cell count, unspecified: Secondary | ICD-10-CM

## 2017-03-16 DIAGNOSIS — Z01419 Encounter for gynecological examination (general) (routine) without abnormal findings: Secondary | ICD-10-CM | POA: Diagnosis not present

## 2017-03-16 NOTE — Progress Notes (Signed)
56 y.o. G3P3 DivorcedCaucasianF here for annual exam.  Doing well.  Denies vaginal bleeding.  With significant other for three years now.  Bought own home.  Really happy.  Has place on back she'd like me to look at today.  Patient's last menstrual period was 04/28/2015.          Sexually active: Yes.    The current method of family planning is post menopausal status.    Exercising: Yes.    walking Smoker:  Former smoker  Health Maintenance: Pap:  03/31/14 WNL History of abnormal Pap:  yes MMG:  10/24/15 BIRADS 1 negative/density c Colonoscopy:  2/15-repeat in 10 years BMD:   none TDaP:  2011 Pneumonia vaccine(s):  never Zostavax:  Never Hep C testing: discuss with provider Screening Labs: discuss with provider, Hb today: discuss with provider   reports that she has quit smoking. Her smoking use included Cigarettes. She has never used smokeless tobacco. She reports that she drinks about 1.2 oz of alcohol per week . She reports that she does not use drugs.  Past Medical History:  Diagnosis Date  . Anxiety   . Cluster headaches   . Other specified glaucoma   . Pain around left eye     several bouts  sicne 2011.     Past Surgical History:  Procedure Laterality Date  . AUGMENTATION MAMMAPLASTY  2010  . [place] SURGERY     2001  . iud removed      Current Outpatient Prescriptions  Medication Sig Dispense Refill  . ibuprofen (ADVIL,MOTRIN) 200 MG tablet Take 200 mg by mouth every 6 (six) hours as needed for pain. Pt only takes as needed which is not often    . timolol (BETIMOL) 0.25 % ophthalmic solution 1-2 drops 2 (two) times daily.    . TRAVATAN Z 0.004 % SOLN ophthalmic solution PLACE 1 DROP INTO THE LEFT EYE NIGHTLY  5   No current facility-administered medications for this visit.     Family History  Problem Relation Age of Onset  . Heart attack Father   . Diabetes Father   . Stroke Mother   . Heart attack Mother   . Stomach cancer Maternal Aunt   . Colon  cancer Neg Hx     ROS:  Pertinent items are noted in HPI.  Otherwise, a comprehensive ROS was negative.  Exam:   BP 118/76 (BP Location: Right Arm, Patient Position: Sitting, Cuff Size: Normal)   Pulse (!) 58   Resp 14   Ht 5' 5.5" (1.664 m)   Wt 150 lb (68 kg)   LMP 04/28/2015   BMI 24.58 kg/m   Weight change:  Height: 5' 5.5" (166.4 cm)  Ht Readings from Last 3 Encounters:  03/16/17 5' 5.5" (1.664 m)  02/11/17 5' 6.5" (1.689 m)  05/11/15 5' 6.5" (1.689 m)    General appearance: alert, cooperative and appears stated age Head: Normocephalic, without obvious abnormality, atraumatic Neck: no adenopathy, supple, symmetrical, trachea midline and thyroid normal to inspection and palpation Lungs: clear to auscultation bilaterally Breasts: normal appearance, no masses or tenderness Heart: regular rate and rhythm Abdomen: soft, non-tender; bowel sounds normal; no masses,  no organomegaly Extremities: extremities normal, atraumatic, no cyanosis or edema Skin: Skin color, texture, turgor normal. No rashes or lesions Lymph nodes: Cervical, supraclavicular, and axillary nodes normal. No abnormal inguinal nodes palpated Neurologic: Grossly normal   Pelvic: External genitalia:  no lesions  Urethra:  normal appearing urethra with no masses, tenderness or lesions              Bartholins and Skenes: normal                 Vagina: normal appearing vagina with normal color and discharge, no lesions              Cervix: no lesions              Pap taken: Yes.   Bimanual Exam:  Uterus:  normal size, contour, position, consistency, mobility, non-tender              Adnexa: normal adnexa and no mass, fullness, tenderness               Rectovaginal: Confirms               Anus:  normal sphincter tone, no lesions  Chaperone was present for exam.  A:  Well Woman with normal exam PMP, no HRT H/O depression that was situational and resolved, off medicatoin Former  smoker Glaucoma  P:   Mammogram guidelines reviewed pap smear and HR HPV obtained today CBC, CMP, Lipids, TSH, Vit D obtained today Hep C antibody obtained today Return annually or prn

## 2017-03-17 LAB — COMPREHENSIVE METABOLIC PANEL
ALBUMIN: 4.8 g/dL (ref 3.5–5.5)
ALT: 32 IU/L (ref 0–32)
AST: 32 IU/L (ref 0–40)
Albumin/Globulin Ratio: 2 (ref 1.2–2.2)
Alkaline Phosphatase: 93 IU/L (ref 39–117)
BUN / CREAT RATIO: 14 (ref 9–23)
BUN: 12 mg/dL (ref 6–24)
Bilirubin Total: 0.4 mg/dL (ref 0.0–1.2)
CALCIUM: 9.4 mg/dL (ref 8.7–10.2)
CO2: 27 mmol/L (ref 20–29)
Chloride: 99 mmol/L (ref 96–106)
Creatinine, Ser: 0.85 mg/dL (ref 0.57–1.00)
GFR calc Af Amer: 89 mL/min/{1.73_m2} (ref >59)
GFR, EST NON AFRICAN AMERICAN: 77 mL/min/{1.73_m2} (ref >59)
GLOBULIN, TOTAL: 2.4 g/dL (ref 1.5–4.5)
Glucose: 86 mg/dL (ref 65–99)
Potassium: 4.1 mmol/L (ref 3.5–5.2)
SODIUM: 139 mmol/L (ref 134–144)
Total Protein: 7.2 g/dL (ref 6.0–8.5)

## 2017-03-17 LAB — LIPID PANEL
CHOLESTEROL TOTAL: 210 mg/dL — AB (ref 100–199)
Chol/HDL Ratio: 2.6 ratio (ref 0.0–4.4)
HDL: 82 mg/dL (ref >39)
LDL Calculated: 115 mg/dL — ABNORMAL HIGH (ref 0–99)
TRIGLYCERIDES: 67 mg/dL (ref 0–149)
VLDL CHOLESTEROL CAL: 13 mg/dL (ref 5–40)

## 2017-03-17 LAB — TSH: TSH: 1.49 u[IU]/mL (ref 0.450–4.500)

## 2017-03-17 LAB — VITAMIN D 25 HYDROXY (VIT D DEFICIENCY, FRACTURES): VIT D 25 HYDROXY: 32.3 ng/mL (ref 30.0–100.0)

## 2017-03-17 LAB — CBC
HEMATOCRIT: 41.5 % (ref 34.0–46.6)
Hemoglobin: 13.7 g/dL (ref 11.1–15.9)
MCH: 28.9 pg (ref 26.6–33.0)
MCHC: 33 g/dL (ref 31.5–35.7)
MCV: 88 fL (ref 79–97)
Platelets: 241 10*3/uL (ref 150–379)
RBC: 4.74 x10E6/uL (ref 3.77–5.28)
RDW: 14.3 % (ref 12.3–15.4)
WBC: 13 10*3/uL — AB (ref 3.4–10.8)

## 2017-03-17 LAB — HEPATITIS C ANTIBODY: Hep C Virus Ab: <0.1 s/co ratio (ref 0.0–0.9)

## 2017-03-18 LAB — CYTOLOGY - PAP
Diagnosis: NEGATIVE
HPV: NOT DETECTED

## 2017-03-19 ENCOUNTER — Telehealth: Payer: Self-pay | Admitting: *Deleted

## 2017-03-19 NOTE — Addendum Note (Signed)
Addended by: Megan Salon on: 03/19/2017 03:36 PM   Modules accepted: Orders

## 2017-03-19 NOTE — Telephone Encounter (Signed)
Message left to return call to Rhyan Wolters at [phone].    

## 2017-03-19 NOTE — Telephone Encounter (Signed)
-----   Message from Megan Salon, MD sent at 03/19/2017  3:35 PM EDT ----- Please let pt know her here CBC showed elevated WBC ct.  She was having no symptoms of infection.  Need to repeat with differential.  Order has been placed.   CMP normal.  Lipids mildly elevated at 210 with LDLs 115.  TSH normal.  Vit D normal.   Hep C negative. Pap neg with neg HR HPV.  02 recall.

## 2017-03-25 NOTE — Telephone Encounter (Signed)
Spoke with patient, advised as seen below per Dr. Sabra Heck. Patient scheduled for lab appointment on 03/27/17 at 8:40am. Patient request copy of labs dated 03/16/17, will pick up day of lab appointment. Patient verbalizes understanding and is agreeable.   Copy of requested labs placed in drawer for pick up.   Patient is agreeable to disposition. Will close encounter.

## 2017-03-27 ENCOUNTER — Other Ambulatory Visit (INDEPENDENT_AMBULATORY_CARE_PROVIDER_SITE_OTHER): Payer: Commercial Managed Care - PPO

## 2017-03-27 DIAGNOSIS — D72829 Elevated white blood cell count, unspecified: Secondary | ICD-10-CM

## 2017-03-27 LAB — CBC WITH DIFFERENTIAL/PLATELET
BASOS: 0 %
Basophils Absolute: 0 10*3/uL (ref 0.0–0.2)
EOS (ABSOLUTE): 0.1 10*3/uL (ref 0.0–0.4)
EOS: 1 %
HEMOGLOBIN: 13.6 g/dL (ref 11.1–15.9)
Hematocrit: 42.5 % (ref 34.0–46.6)
IMMATURE GRANS (ABS): 0 10*3/uL (ref 0.0–0.1)
IMMATURE GRANULOCYTES: 0 %
LYMPHS: 68 %
Lymphocytes Absolute: 7.7 10*3/uL — ABNORMAL HIGH (ref 0.7–3.1)
MCH: 29.6 pg (ref 26.6–33.0)
MCHC: 32 g/dL (ref 31.5–35.7)
MCV: 92 fL (ref 79–97)
MONOCYTES: 4 %
Monocytes Absolute: 0.4 10*3/uL (ref 0.1–0.9)
NEUTROS ABS: 3 10*3/uL (ref 1.4–7.0)
NEUTROS PCT: 27 %
PLATELETS: 228 10*3/uL (ref 150–379)
RBC: 4.6 x10E6/uL (ref 3.77–5.28)
RDW: 14.3 % (ref 12.3–15.4)
WBC: 11.3 10*3/uL — ABNORMAL HIGH (ref 3.4–10.8)

## 2017-04-07 ENCOUNTER — Telehealth: Payer: Self-pay | Admitting: Obstetrics & Gynecology

## 2017-04-07 NOTE — Telephone Encounter (Signed)
Patient is calling for CBC results from 03/27/2017. Results printed and to [facility] for review.

## 2017-04-07 NOTE — Telephone Encounter (Signed)
Spoke with patient. Advised patient that [place] reviewed lab results from 03/27/2017 and 03/16/2017 with Dr.Ennever as WBC has come down but is still elevated and lymphocytes are elevated. It is recommended that she have this lab retested in 1 month. If still elevated referral to Dr.Ennever for additional testing is recommended. Patient is agreeable. Lab appointment scheduled for 04/29/2017 at 8:40 am. Patient is agreeable to date and time.  Routing to [place] for final review before closing.

## 2017-04-07 NOTE — Telephone Encounter (Signed)
Patient requesting lab results

## 2017-04-29 ENCOUNTER — Other Ambulatory Visit: Payer: Commercial Managed Care - PPO

## 2017-04-30 ENCOUNTER — Other Ambulatory Visit: Payer: Commercial Managed Care - PPO

## 2017-04-30 DIAGNOSIS — D72829 Elevated white blood cell count, unspecified: Secondary | ICD-10-CM

## 2017-04-30 DIAGNOSIS — D72820 Lymphocytosis (symptomatic): Secondary | ICD-10-CM

## 2017-04-30 LAB — CBC WITH DIFFERENTIAL/PLATELET
BASOS: 0 %
Basophils Absolute: 0 10*3/uL (ref 0.0–0.2)
EOS (ABSOLUTE): 0.1 10*3/uL (ref 0.0–0.4)
EOS: 1 %
HEMATOCRIT: 41.3 % (ref 34.0–46.6)
Hemoglobin: 13.1 g/dL (ref 11.1–15.9)
Immature Grans (Abs): 0 10*3/uL (ref 0.0–0.1)
Immature Granulocytes: 0 %
Lymphocytes Absolute: 8.9 10*3/uL — ABNORMAL HIGH (ref 0.7–3.1)
Lymphs: 70 %
MCH: 28.4 pg (ref 26.6–33.0)
MCHC: 31.7 g/dL (ref 31.5–35.7)
MCV: 89 fL (ref 79–97)
MONOS ABS: 0.6 10*3/uL (ref 0.1–0.9)
Monocytes: 5 %
NEUTROS ABS: 3 10*3/uL (ref 1.4–7.0)
Neutrophils: 24 %
PLATELETS: 225 10*3/uL (ref 150–379)
RBC: 4.62 x10E6/uL (ref 3.77–5.28)
RDW: 14.4 % (ref 12.3–15.4)
WBC: 12.6 10*3/uL — ABNORMAL HIGH (ref 3.4–10.8)

## 2017-05-07 ENCOUNTER — Telehealth: Payer: Self-pay | Admitting: *Deleted

## 2017-05-07 NOTE — Telephone Encounter (Signed)
Notes recorded by Burnice Logan, RN on 05/07/2017 at 11:26 AM EDT Left message to call Sharee Pimple at [phone]. See telephone encounter dated 05/07/17.  ------  Notes recorded by Megan Salon, MD on 05/06/2017 at 3:55 PM EDT Please let pt know her white cell count continues to be mildly elevated and the lymphocytes are still elevated so I want her to see Dr. Marin Olp, hematology, for additional evaluation. I have placed referral for this but can you see if this can be expedited? Thanks.

## 2017-05-08 NOTE — Telephone Encounter (Signed)
Patient returned call to Jill. °

## 2017-05-08 NOTE — Telephone Encounter (Signed)
Call to patient. Reviewed lab results and recommendation from Dr Sabra Heck. Patient states she has already received call from Dr Antonieta Pert office and appointment is scheduled for 06-03-17.   Dr Sabra Heck, [place] appointment date.  Encounter closed.

## 2017-06-02 ENCOUNTER — Other Ambulatory Visit: Payer: Self-pay | Admitting: Family

## 2017-06-02 DIAGNOSIS — D72820 Lymphocytosis (symptomatic): Secondary | ICD-10-CM

## 2017-06-03 ENCOUNTER — Other Ambulatory Visit: Payer: Commercial Managed Care - PPO

## 2017-06-03 ENCOUNTER — Ambulatory Visit (HOSPITAL_BASED_OUTPATIENT_CLINIC_OR_DEPARTMENT_OTHER): Payer: Commercial Managed Care - PPO | Admitting: Family

## 2017-06-03 ENCOUNTER — Other Ambulatory Visit (HOSPITAL_COMMUNITY)
Admission: RE | Admit: 2017-06-03 | Discharge: 2017-06-03 | Disposition: A | Discharge status: Home / Self Care | Payer: Commercial Managed Care - PPO | Source: Ambulatory Visit | Attending: Hematology & Oncology | Admitting: Hematology & Oncology

## 2017-06-03 ENCOUNTER — Encounter: Payer: Self-pay | Admitting: Family

## 2017-06-03 ENCOUNTER — Ambulatory Visit: Payer: Commercial Managed Care - PPO | Admitting: Family

## 2017-06-03 VITALS — BP 126/78 | HR 60 | Temp 98.0°F | Resp 18 | Ht 66.0 in | Wt 150.0 lb

## 2017-06-03 DIAGNOSIS — D72820 Lymphocytosis (symptomatic): Secondary | ICD-10-CM | POA: Insufficient documentation

## 2017-06-03 LAB — CBC WITH DIFFERENTIAL (CANCER CENTER ONLY)
BASO#: 0 10*3/uL (ref 0.0–0.2)
BASO%: 0.2 % (ref 0.0–2.0)
EOS%: 0.9 % (ref 0.0–7.0)
Eosinophils Absolute: 0.1 10*3/uL (ref 0.0–0.5)
HEMATOCRIT: 41.1 % (ref 34.8–46.6)
HGB: 13.9 g/dL (ref 11.6–15.9)
LYMPH#: 10.2 10*3/uL — ABNORMAL HIGH (ref 0.9–3.3)
LYMPH%: 74.6 % — AB (ref 14.0–48.0)
MCH: 29.6 pg (ref 26.0–34.0)
MCHC: 33.8 g/dL (ref 32.0–36.0)
MCV: 88 fL (ref 81–101)
MONO#: 0.6 10*3/uL (ref 0.1–0.9)
MONO%: 4.3 % (ref 0.0–13.0)
NEUT#: 2.7 10*3/uL (ref 1.5–6.5)
NEUT%: 20 % — AB (ref 39.6–80.0)
PLATELETS: 202 10*3/uL (ref 145–400)
RBC: 4.69 10*6/uL (ref 3.70–5.32)
RDW: 13.2 % (ref 11.1–15.7)
WBC: 13.6 10*3/uL — ABNORMAL HIGH (ref 3.9–10.0)

## 2017-06-03 LAB — CMP (CANCER CENTER ONLY)
ALK PHOS: 84 U/L (ref 26–84)
ALT: 46 U/L (ref 10–47)
AST: 36 U/L (ref 11–38)
Albumin: 3.8 g/dL (ref 3.3–5.5)
BILIRUBIN TOTAL: 0.5 mg/dL (ref 0.20–1.60)
BUN: 14 mg/dL (ref 7–22)
CO2: 33 meq/L (ref 18–33)
CREATININE: 1 mg/dL (ref 0.6–1.2)
Calcium: 9.5 mg/dL (ref 8.0–10.3)
Chloride: 102 mEq/L (ref 98–108)
GLUCOSE: 107 mg/dL (ref 73–118)
Potassium: 4.6 mEq/L (ref 3.3–4.7)
SODIUM: 146 meq/L — AB (ref 128–145)
Total Protein: 7.5 g/dL (ref 6.4–8.1)

## 2017-06-03 LAB — LACTATE DEHYDROGENASE: LDH: 186 U/L (ref 125–245)

## 2017-06-03 LAB — CHCC SATELLITE - SMEAR
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
## 2017-06-05 ENCOUNTER — Telehealth: Payer: Self-pay | Admitting: Family

## 2017-06-05 NOTE — Telephone Encounter (Signed)
Left message with call back number for results.  

## 2017-06-08 ENCOUNTER — Telehealth: Payer: Self-pay | Admitting: Family

## 2017-06-08 LAB — FLOW CYTOMETRY - CHCC SATELLITE

## 2017-06-08 NOTE — Telephone Encounter (Signed)
I spoke with Danielle Kim about her pathology report and confirmation of CLL. All questions were answered and we will plan to see her back again in another 6 months for follow-up.

## 2017-06-15 ENCOUNTER — Encounter: Payer: Self-pay | Admitting: Obstetrics & Gynecology

## 2017-07-09 NOTE — Patient Instructions (Signed)
Chronic Lymphocytic Leukemia Chronic lymphocytic leukemia (CLL) is a type of cancer of the blood cells and soft tissue inside bones (bone marrow). CLL happens when your bone marrow makes too many abnormal white blood cells. The cells, called leukemia cells, do not function normally and accumulate in the blood. Eventually they crowd out other healthy blood cells. CLL usually gets worse slowly. It can cause complications in your organs, such as in your spleen. It can also weaken your immune system and lead to conditions in which your immune system attacks your body (autoimmune conditions). What are the causes? The cause of this condition is not known. What increases the risk? You are more likely to develop this condition if:  You are older than 50 years.  You are white.  You are female.  You have a family history of CLL or other cancers of the lymph system.  You are of [place] or [place] descent.  You have been exposed to certain chemicals, such as: ? Insecticides. ? Herbicides. These include Agent Orange, a herbicide used in the [place] war.  What are the signs or symptoms? At first, there may be no symptoms. After a while, symptoms may include:  Feeling more tired than usual, even after rest.  Unplanned weight loss.  Heavy sweating at night.  Fever.  Shortness of breath.  Paleness.  Painless, swollen lymph nodes.  A feeling of fullness in the upper left part of the abdomen.  Easy bruising or bleeding.  Frequent infections.  How is this diagnosed? This condition is diagnosed based on:  A physical exam to check for an enlarged spleen, liver, or lymph nodes.  Blood and bone marrow tests to check for leukemia cells. Tests may include: ? A complete blood count. ? Flow cytometry. This method uses light sensors and dyes to figure out the number of cells as well as their size, structure, and general health. ? Immunophenotyping. This method is used to  diagnose leukemia by identifying specific antibodies found in white blood cells. The test is used when a complete blood count shows the presence of immature cells or a high number of white blood cells. ? Fluorescence in situ hybridization (FISH). This test is used to examine defects in chromosomes and how those defects affect the functioning of the cell. Results from a [place] test will be used to determine treatment and assess the outcome of that treatment.  A CT scan to check for swelling or anything abnormal in your spleen, liver, and lymph nodes.  How is this treated? Treatment for this condition depends on the stage of the leukemia and whether you have symptoms. Treatment may include:  Observation.  Targeted drugs. These are medicines that interfere with the way leukemia cells grow and multiply. They identify and attack specific leukemia cells without harming normal cells.  Chemotherapy drugs. These are medicines that kill leukemia cells that are multiplying quickly.  Radiation.  Surgery to remove the spleen.  Biological therapy (immunotherapy). This treatment boosts the ability of your immune system to fight the leukemia cells.  Bone marrow or peripheral blood stem cell transplant. This treatment replaces your own bone marrow or stem cells with bone marrow or stem cells from a donor. This treatment may be done after you receive very high doses of chemotherapy or radiation that kill your stem cells and bone marrow.  New treatments through clinical trials.  Additional medicines may be needed to help manage symptoms. Follow these instructions at home: Medicines  Take  over-the-counter and prescription medicines only as told by your health care provider.  If you were prescribed an antibiotic medicine, take it as told by your health care provider. Do not stop taking the antibiotic even if you start to feel better. If you are on chemotherapy:  Wash your hands often, especially before  meals, after being outside, and after using the toilet. Have visitors do the same.  Keep your teeth and gums clean and well cared for. Use soft toothbrushes.  Protect your skin from the sun by using sunscreen and wearing protective clothing. General instructions  Avoid contact sports or other rough activities. Ask your health care provider what activities are safe for you.  Avoid crowded places and people who are sick.  Tell your cancer care team if you develop side effects. They may be able to recommend ways to relieve them.  Try to eat regular, healthy meals. Some of your treatments might affect your appetite.  Find healthy ways of coping with stress, such as by doing yoga or meditation or by joining a support group.  Keep all follow-up visits as told by your health care provider. This is important. Where to find more information:  American Cancer Society: www.cancer.org  Leukemia and Lymphoma Society: PreviewPal.pl  National Cancer Institute ([place]): www.cancer.gov Contact a health care provider if:  You have pain in your abdomen.  You develop new bruises that are getting bigger.  You have painful or more swollen lymph nodes.  You develop bleeding from your gums or nose.  You cannot eat or drink without vomiting.  You feel lightheaded. Get help right away if:  You have a fever or chills.  You develop chest pain.  You have trouble breathing or feel short of breath.  You faint.  There is blood in your urine or stool.  You have excessive bleeding.  You have any symptoms that are severe or uncontrolled. Summary  Chronic lymphocytic leukemia (CLL) is a type of cancer of the blood cells and bone marrow.  This condition can cause an enlarged spleen, swollen lymph nodes, a weakened immune system, low red blood cell and platelets counts, and autoimmune conditions.  Treatment for this condition depends on the stage of the cancer and whether you have  symptoms.  Chemotherapy, radiation, surgery to remove the spleen, and bone marrow transplant are some of the ways to treat CLL. This information is not intended to replace advice given to you by your health care provider. Make sure you discuss any questions you have with your health care provider. Document Released: 11/30/2008 Document Revised: 06/25/2016 Document Reviewed: 06/25/2016 Elsevier Interactive Patient Education  2017 [employer].

## 2017-08-31 ENCOUNTER — Telehealth: Payer: Self-pay | Admitting: *Deleted

## 2017-08-31 NOTE — Telephone Encounter (Signed)
Please call and see if she can come at 12:30pm on 09/01/17.  I can leave the OR during the second surgery with Dr. Talbert Nan if needed.  I'm only there to learn.  So, I can be in the office at that time if it works for the pt.

## 2017-08-31 NOTE — Telephone Encounter (Signed)
Spoke with patient. Patient states that she had a "bump" on her vagina that is tender and looks like a "pimple." States this has occurred before and it worsened over a few days. Worried about this progressing. Only wants to see Dr.Miller. Advised will review with MD and return call.

## 2017-08-31 NOTE — Telephone Encounter (Signed)
Spoke with patient. Appointment scheduled for 09/02/2017 at 11:45 am with Dr.Miller (time per Lamont Snowball). Patient is agreeable to date and time.  Routing to provider for final review. Patient agreeable to disposition. Will close encounter.

## 2017-08-31 NOTE — Telephone Encounter (Signed)
Left message to call Yitzel Shasteen at [phone]. 

## 2017-08-31 NOTE — Telephone Encounter (Signed)
Patient has a pimple like bump on her vagina.

## 2017-09-01 ENCOUNTER — Other Ambulatory Visit: Payer: Self-pay

## 2017-09-01 ENCOUNTER — Encounter: Payer: Self-pay | Admitting: Obstetrics & Gynecology

## 2017-09-01 ENCOUNTER — Ambulatory Visit: Payer: Commercial Managed Care - PPO | Admitting: Obstetrics & Gynecology

## 2017-09-01 VITALS — BP 112/68 | HR 68 | Resp 16 | Wt 157.0 lb

## 2017-09-01 DIAGNOSIS — R8290 Unspecified abnormal findings in urine: Secondary | ICD-10-CM

## 2017-09-01 DIAGNOSIS — N766 Ulceration of vulva: Secondary | ICD-10-CM

## 2017-09-01 DIAGNOSIS — C9110 Chronic lymphocytic leukemia of B-cell type not having achieved remission: Secondary | ICD-10-CM

## 2017-09-01 DIAGNOSIS — C9190 Lymphoid leukemia, unspecified not having achieved remission: Secondary | ICD-10-CM

## 2017-09-01 LAB — POCT URINALYSIS DIPSTICK
APPEARANCE: NORMAL
Bilirubin, UA: NEGATIVE
GLUCOSE UA: NEGATIVE
Ketones, UA: NEGATIVE
Leukocytes, UA: NEGATIVE
NITRITE UA: NEGATIVE
PH UA: 5 (ref 5.0–8.0)
Protein, UA: NEGATIVE
RBC UA: NEGATIVE
Urobilinogen, UA: 0.2 E.U./dL

## 2017-09-01 MED ORDER — VALACYCLOVIR HCL 1 G PO TABS: 1000.0000 mg | ORAL_TABLET | Freq: Two times a day (BID) | ORAL | 0 refills | Status: DC

## 2017-09-01 NOTE — Telephone Encounter (Signed)
Call to patient. Patient states she is available to come in today at 1230. Appointment changed to 09/01/17 at 1230.   Routing to provider for final review. Patient agreeable to disposition. Will close encounter.

## 2017-09-01 NOTE — Progress Notes (Signed)
GYNECOLOGY  VISIT  CC:   Vulvar lesion  HPI: 57 y.o. G3P3 Divorced Caucasian female here for complaint of vulvar lesion that has occurred now in the same place on three separate occasions.  The first occurrence was in July 2018.  She saw Melvia Heaps, CNM, for this complaint.  At that time it looked like fine macular rash that the patient that was made worse by using a topical triple antibiotic ointment she had at home.  Aveeno eczema cream and Aveeno her sitz baths were recommended.  No specific testing was done.  Patient reports he took about 3 weeks for the area to finally fully heal.  Then several months later, the area became tender and slightly inflamed again.  This took 2 weeks to heal.    She then called yesterday with complaint of a "bump" on her vulva that looks sort of like a small pimple.  It is painful.  She denies overall fatigue or malaise.  She has not noted any lymphadenopathy.  She denies any vaginal discharge.  Her urine is sort of cloudy to appearance in her opinion but there is no dysuria or hematuria.  She denies vaginal bleeding.  She did have an elevated white blood cell count at her last annual exam.  This was repeated.  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  For the time being, this is going to be followed conservatively.  In three year relationship with partner.  This has been a great relationship.  GYNECOLGIC HISTORY: Patient's last menstrual period was 04/28/2015. Contraception: PMP Menopausal hormone therapy: none  Patient Active Problem List   Diagnosis Date Noted  . Trigger middle finger of right hand 11/07/2015  . Insomnia 03/31/2014  . Pain around left eye   . Secondary open-angle glaucoma of left eye, severe stage 02/07/2013    Past Medical History:  Diagnosis Date  . Anxiety   . Cluster headaches   . Other specified glaucoma   . Pain around left eye     several bouts  sicne 2011.     Past Surgical History:  Procedure  Laterality Date  . AUGMENTATION MAMMAPLASTY  2010  . [place] SURGERY     2001  . iud removed      MEDS:   Current Outpatient Medications on File Prior to Visit  Medication Sig Dispense Refill  . ibuprofen (ADVIL,MOTRIN) 200 MG tablet Take 200 mg by mouth every 6 (six) hours as needed for pain. Pt only takes as needed which is not often    . timolol (BETIMOL) 0.25 % ophthalmic solution 1-2 drops 2 (two) times daily.    . TRAVATAN Z 0.004 % SOLN ophthalmic solution PLACE 1 DROP INTO THE LEFT EYE NIGHTLY  5  . SHINGRIX injection ADM 0.5ML IM UTD  0   No current facility-administered medications on file prior to visit.     ALLERGIES: Patient has no known allergies.  Family History  Problem Relation Age of Onset  . Heart attack Father   . Diabetes Father   . Stroke Mother   . Heart attack Mother   . Stomach cancer Maternal Aunt   . Colon cancer Neg Hx     SH:  Divorced, non smoker  Review of Systems  Constitutional: Negative.   Eyes: Negative.   Cardiovascular: Negative.   Gastrointestinal: Negative.   Genitourinary: Negative.        Vulvar pain  Endo/Heme/Allergies: Negative.     PHYSICAL EXAMINATION:  BP 112/68 (BP Location: Right Arm, Patient Position: Sitting, Cuff Size: Normal)   Pulse 68   Resp 16   Wt 157 lb (71.2 kg)   LMP 04/28/2015   BMI 25.34 kg/m     Physical Exam  Constitutional: She is oriented to person, place, and time. She appears well-developed and well-nourished.  GI: Soft. Bowel sounds are normal. She exhibits no distension. There is no tenderness. There is no rebound and no guarding.  Genitourinary: Vagina normal.    There is tenderness and lesion on the right labia. There is no rash or injury on the right labia. There is no rash, tenderness, lesion or injury on the left labia.  Lymphadenopathy:       Right: No inguinal adenopathy present.       Left: No inguinal adenopathy present.  Neurological: She is alert and oriented to  person, place, and time.  Skin: Skin is warm and dry.  Psychiatric: She has a normal mood and affect.   Chaperone was present for exam.  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  Pt had many questions which were addressed.

## 2017-09-02 ENCOUNTER — Ambulatory Visit: Payer: Commercial Managed Care - PPO | Admitting: Obstetrics & Gynecology

## 2017-09-02 DIAGNOSIS — C9110 Chronic lymphocytic leukemia of B-cell type not having achieved remission: Secondary | ICD-10-CM | POA: Insufficient documentation

## 2017-09-02 LAB — HERPES SIMPLEX VIRUS(HSV) DNA BY PCR
HSV 2 DNA: POSITIVE — AB
HSV-1 DNA: NEGATIVE

## 2017-09-04 ENCOUNTER — Other Ambulatory Visit: Payer: Self-pay | Admitting: Obstetrics & Gynecology

## 2017-09-04 ENCOUNTER — Telehealth: Payer: Self-pay

## 2017-09-04 DIAGNOSIS — Z113 Encounter for screening for infections with a predominantly sexual mode of transmission: Secondary | ICD-10-CM

## 2017-09-04 NOTE — Telephone Encounter (Signed)
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  I suspected this was what she had and I informed her of this on the day of her appointment.  She will not be surprised by the results.  She needs to finish the antiviral medication.  She indicated, she was going to wait and see if she had any other symptoms after this resolved.  I do think it is a good idea to do full STD testing at this time as well.  Can be done with just lab and urine testing if desires.  I will enter orders if she decides to return for this.  Thanks.

## 2017-09-04 NOTE — Telephone Encounter (Signed)
Left message to call Kaitlyn at [phone]. 

## 2017-09-04 NOTE — Telephone Encounter (Signed)
Spoke with patient. Results given. Paitent verbalizes understanding. Patient would like to return for full STD testing. Appointment scheduled for 09/16/2017 at 8:40 am. Patient is agreeable to date and time. Routing to [place] for orders.

## 2017-09-04 NOTE — Telephone Encounter (Signed)
Patient returned call

## 2017-09-04 NOTE — Telephone Encounter (Signed)
Orders placed for HIV, RPR, Hep B, GC/Chl (off urine).  Ok to close encounter.

## 2017-09-16 ENCOUNTER — Other Ambulatory Visit: Payer: Commercial Managed Care - PPO

## 2017-09-22 ENCOUNTER — Telehealth: Payer: Self-pay | Admitting: Obstetrics & Gynecology

## 2017-09-22 ENCOUNTER — Telehealth: Payer: Self-pay | Admitting: *Deleted

## 2017-09-22 DIAGNOSIS — D72820 Lymphocytosis (symptomatic): Secondary | ICD-10-CM

## 2017-09-22 NOTE — Telephone Encounter (Signed)
Spoke with patient. Patient calling to request 6 month f/u labs that are due with hematology/oncology be drawn at lab visit on 09/23/17. Patient states she discussed this with Dr. Sabra Heck at [facility] on 09/01/17 and was agreeable.    Advised patient will review with Dr. Sabra Heck and return call, patient is agreeable.    Dr. Sabra Heck -please advise on labs? Patient is scheduled for 2/27 at 8:30am.

## 2017-09-22 NOTE — Telephone Encounter (Signed)
Call returned to patient, no answer, left detailed message, ok per current dpr.   Advised last AEX 03/16/17, would be too early to collect annual screening labs. Keep appointment as scheduled for 2/27 for f/u labs. Return call to office with any additional questions/concerns.   Routing to provider for final review. Patient is agreeable to disposition. Will close encounter.

## 2017-09-22 NOTE — Telephone Encounter (Signed)
Patient is scheduled for blood work tomorrow and would like to have any labs that she may get with her aex in October done tomorrow if possible.

## 2017-09-23 ENCOUNTER — Other Ambulatory Visit (INDEPENDENT_AMBULATORY_CARE_PROVIDER_SITE_OTHER): Payer: Commercial Managed Care - PPO

## 2017-09-23 DIAGNOSIS — Z113 Encounter for screening for infections with a predominantly sexual mode of transmission: Secondary | ICD-10-CM

## 2017-09-23 DIAGNOSIS — D72820 Lymphocytosis (symptomatic): Secondary | ICD-10-CM

## 2017-09-23 NOTE — Telephone Encounter (Signed)
Call to patient. Left message to call back.  Last appointment with [facility] on 06-03-17 and due for repeat labs in 6 months.  Follow-up appointment is scheduled for 12-02-17.

## 2017-09-23 NOTE — Telephone Encounter (Signed)
Return call from patient.  States she does not plan to return to [facility]. Was told no treatment needed unless WBC > 30 by hematology.    She plans to monitor CBC through our office.   Advised she is encouraged to continue management through [facility]/ hematology. Will review requests with Dr Sabra Heck.

## 2017-09-23 NOTE — Telephone Encounter (Signed)
Reviewed with Dr Sabra Heck. Ok to proceed with labs.  Patient notified. Encounter closed.

## 2017-09-24 LAB — CBC WITH DIFFERENTIAL/PLATELET
BASOS ABS: 0 10*3/uL (ref 0.0–0.2)
BASOS: 0 %
EOS (ABSOLUTE): 0.2 10*3/uL (ref 0.0–0.4)
Eos: 1 %
Hematocrit: 44.4 % (ref 34.0–46.6)
Hemoglobin: 14.4 g/dL (ref 11.1–15.9)
IMMATURE GRANS (ABS): 0 10*3/uL (ref 0.0–0.1)
IMMATURE GRANULOCYTES: 0 %
Lymphocytes Absolute: 10.1 10*3/uL — ABNORMAL HIGH (ref 0.7–3.1)
Lymphs: 74 %
MCH: 29.6 pg (ref 26.6–33.0)
MCHC: 32.4 g/dL (ref 31.5–35.7)
MCV: 91 fL (ref 79–97)
Monocytes Absolute: 0.4 10*3/uL (ref 0.1–0.9)
Monocytes: 3 %
NEUTROS PCT: 22 %
Neutrophils Absolute: 3 10*3/uL (ref 1.4–7.0)
PLATELETS: 248 10*3/uL (ref 150–379)
RBC: 4.87 x10E6/uL (ref 3.77–5.28)
RDW: 14.9 % (ref 12.3–15.4)
WBC: 13.6 10*3/uL — AB (ref 3.4–10.8)

## 2017-09-24 LAB — COMPREHENSIVE METABOLIC PANEL
A/G RATIO: 1.9 (ref 1.2–2.2)
ALK PHOS: 95 IU/L (ref 39–117)
ALT: 35 IU/L — AB (ref 0–32)
AST: 24 IU/L (ref 0–40)
Albumin: 4.8 g/dL (ref 3.5–5.5)
BUN/Creatinine Ratio: 16 (ref 9–23)
BUN: 14 mg/dL (ref 6–24)
Bilirubin Total: 0.3 mg/dL (ref 0.0–1.2)
CALCIUM: 9.5 mg/dL (ref 8.7–10.2)
CO2: 27 mmol/L (ref 20–29)
Chloride: 100 mmol/L (ref 96–106)
Creatinine, Ser: 0.85 mg/dL (ref 0.57–1.00)
GFR calc Af Amer: 89 mL/min/{1.73_m2} (ref >59)
GFR, EST NON AFRICAN AMERICAN: 77 mL/min/{1.73_m2} (ref >59)
Globulin, Total: 2.5 g/dL (ref 1.5–4.5)
Glucose: 101 mg/dL — ABNORMAL HIGH (ref 65–99)
POTASSIUM: 5 mmol/L (ref 3.5–5.2)
Sodium: 143 mmol/L (ref 134–144)
Total Protein: 7.3 g/dL (ref 6.0–8.5)

## 2017-09-24 LAB — HEP, RPR, HIV PANEL
HEP B S AG: NEGATIVE
HIV Screen 4th Generation wRfx: NONREACTIVE
RPR: NONREACTIVE

## 2017-09-24 LAB — GC/CHLAMYDIA PROBE AMP
CHLAMYDIA, DNA PROBE: NEGATIVE
NEISSERIA GONORRHOEAE BY PCR: NEGATIVE

## 2017-09-28 ENCOUNTER — Telehealth: Payer: Self-pay | Admitting: *Deleted

## 2017-09-28 NOTE — Telephone Encounter (Signed)
Notes recorded by Polly Cobia, CMA on 09/28/2017 at 1:44 PM EST Pt notified. Verbalized understanding. No need to route to Dr. Marin Olp.  Hard copy mailed to patient.

## 2017-09-28 NOTE — Telephone Encounter (Signed)
-----   Message from Megan Salon, MD sent at 09/26/2017  6:49 AM EST ----- Please notify pt that her GC/Chl, HIV, RPR were negative.  Her CBC showed continued elevated WBC ct that is stable.  Actual lymphocytes were a little higher.  CMP was fine except one live enzyme was mildly elevated by 3 points.  This has been normal.  Will be repeated with future lab work as she is being followed by hematology.  No additional evaluation for this needed right now.  Does she need me to route the lab work to Dr. Marin Olp?  Thanks.

## 2017-09-28 NOTE — Telephone Encounter (Signed)
LM for pt to call back.

## 2017-11-26 ENCOUNTER — Encounter

## 2017-12-02 ENCOUNTER — Ambulatory Visit: Payer: Commercial Managed Care - PPO | Admitting: Family

## 2017-12-02 ENCOUNTER — Other Ambulatory Visit: Payer: Commercial Managed Care - PPO

## 2018-05-20 ENCOUNTER — Ambulatory Visit (INDEPENDENT_AMBULATORY_CARE_PROVIDER_SITE_OTHER): Payer: Commercial Managed Care - PPO | Admitting: Obstetrics & Gynecology

## 2018-05-20 ENCOUNTER — Encounter: Payer: Self-pay | Admitting: Obstetrics & Gynecology

## 2018-05-20 VITALS — BP 110/80 | HR 68 | Resp 16 | Ht 65.75 in | Wt 151.6 lb

## 2018-05-20 DIAGNOSIS — Z0000 Encounter for general adult medical examination without abnormal findings: Secondary | ICD-10-CM | POA: Diagnosis not present

## 2018-05-20 DIAGNOSIS — Z01419 Encounter for gynecological examination (general) (routine) without abnormal findings: Secondary | ICD-10-CM

## 2018-05-20 DIAGNOSIS — C9110 Chronic lymphocytic leukemia of B-cell type not having achieved remission: Secondary | ICD-10-CM

## 2018-05-20 MED ORDER — VALACYCLOVIR HCL 500 MG PO TABS: ORAL_TABLET | ORAL | 1 refills | Status: DC

## 2018-05-20 NOTE — Progress Notes (Addendum)
57 y.o. G3P3 Divorced White or Caucasian female here for annual exam.  Doing well.  Working on diet and exercise and dietary changes.  She is living with her significant other.  Diagnosed with CLL.  Needs CBC today.  Does have HSV outbreak about every 3 months.    Denies vaginal bleeding.    Going to [place] for three weeks in early December.  Patient's last menstrual period was 04/28/2015.          Sexually active: Yes.    The current method of family planning is post menopausal status.    Exercising: Yes.    cardio, weights  Smoker:  no  Health Maintenance: Pap:  03/16/17 Neg. HR HPV:neg  03/31/14 Neg    History of abnormal Pap:  yes MMG:  05/27/17 BIRADS2:Benign  Colonoscopy:  09/14/13 Normal. F/u 10 years  BMD:   Never TDaP:  2011 Pneumonia vaccine(s):  n/a Shingrix:   Completed  Hep C testing: 03/16/17 Neg  Screening Labs: Here today - fasting    reports that she has quit smoking. Her smoking use included cigarettes. She has never used smokeless tobacco. She reports that she drinks about 2.0 standard drinks of alcohol per week. She reports that she does not use drugs.  Past Medical History:  Diagnosis Date  . Anxiety   . Cluster headaches   . Other specified glaucoma   . Pain around left eye     several bouts  sicne 2011.     Past Surgical History:  Procedure Laterality Date  . AUGMENTATION MAMMAPLASTY  2010  . [place] SURGERY     2001  . iud removed      Current Outpatient Medications  Medication Sig Dispense Refill  . ibuprofen (ADVIL,MOTRIN) 200 MG tablet Take 200 mg by mouth every 6 (six) hours as needed for pain. Pt only takes as needed which is not often    . timolol (BETIMOL) 0.25 % ophthalmic solution 1-2 drops 2 (two) times daily.    . TRAVATAN Z 0.004 % SOLN ophthalmic solution PLACE 1 DROP INTO THE LEFT EYE NIGHTLY  5   No current facility-administered medications for this visit.     Family History  Problem Relation Age of Onset  . Heart  attack Father   . Diabetes Father   . Stroke Mother   . Heart attack Mother   . Stomach cancer Maternal Aunt   . Colon cancer Neg Hx     Review of Systems  All other systems reviewed and are negative.   Exam:   BP 110/80 (BP Location: Right Arm, Patient Position: Sitting, Cuff Size: Normal)   Pulse 68   Resp 16   Ht 5' 5.75" (1.67 m)   Wt 151 lb 9.6 oz (68.8 kg)   LMP 04/28/2015   BMI 24.66 kg/m    Height: 5' 5.75" (167 cm)  Ht Readings from Last 3 Encounters:  05/20/18 5' 5.75" (1.67 m)  06/03/17 5\' 6"  (1.676 m)  03/16/17 5' 5.5" (1.664 m)    General appearance: alert, cooperative and appears stated age Head: Normocephalic, without obvious abnormality, atraumatic Neck: no adenopathy, supple, symmetrical, trachea midline and thyroid normal to inspection and palpation Lungs: clear to auscultation bilaterally Breasts: normal appearance, no masses or tenderness Heart: regular rate and rhythm Abdomen: soft, non-tender; bowel sounds normal; no masses,  no organomegaly Extremities: extremities normal, atraumatic, no cyanosis or edema Skin: Skin color, texture, turgor normal. No rashes or lesions Lymph nodes: Cervical, supraclavicular,  and axillary nodes normal. No abnormal inguinal nodes palpated Neurologic: Grossly normal   Pelvic: External genitalia:  no lesions              Urethra:  normal appearing urethra with no masses, tenderness or lesions              Bartholins and Skenes: normal                 Vagina: normal appearing vagina with normal color and discharge, no lesions              Cervix: anterior nabothian cyst noted, no other lesions              Pap taken: No. Bimanual Exam:  Uterus:  normal size, contour, position, consistency, mobility, non-tender              Adnexa: normal adnexa and no mass, fullness, tenderness               Rectovaginal: Confirms               Anus:  normal sphincter tone, no lesions  Chaperone was present for exam.  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  Not indicated today CBC with diff, CMP, Lipids Will communicate with Dr. Marin Olp about her blood work return annually or prn

## 2018-05-21 LAB — COMPREHENSIVE METABOLIC PANEL
A/G RATIO: 2.1 (ref 1.2–2.2)
ALBUMIN: 4.8 g/dL (ref 3.5–5.5)
ALK PHOS: 88 IU/L (ref 39–117)
ALT: 23 IU/L (ref 0–32)
AST: 23 IU/L (ref 0–40)
BUN / CREAT RATIO: 15 (ref 9–23)
BUN: 13 mg/dL (ref 6–24)
Bilirubin Total: 0.3 mg/dL (ref 0.0–1.2)
CO2: 25 mmol/L (ref 20–29)
Calcium: 9.6 mg/dL (ref 8.7–10.2)
Chloride: 102 mmol/L (ref 96–106)
Creatinine, Ser: 0.84 mg/dL (ref 0.57–1.00)
GFR calc Af Amer: 90 mL/min/{1.73_m2} (ref >59)
GFR, EST NON AFRICAN AMERICAN: 78 mL/min/{1.73_m2} (ref >59)
GLOBULIN, TOTAL: 2.3 g/dL (ref 1.5–4.5)
Glucose: 95 mg/dL (ref 65–99)
POTASSIUM: 4.6 mmol/L (ref 3.5–5.2)
Sodium: 144 mmol/L (ref 134–144)
Total Protein: 7.1 g/dL (ref 6.0–8.5)

## 2018-05-21 LAB — CBC WITH DIFFERENTIAL/PLATELET
BASOS ABS: 0.1 10*3/uL (ref 0.0–0.2)
Basos: 0 %
EOS (ABSOLUTE): 0.1 10*3/uL (ref 0.0–0.4)
EOS: 0 %
HEMOGLOBIN: 13.9 g/dL (ref 11.1–15.9)
Hematocrit: 42.4 % (ref 34.0–46.6)
IMMATURE GRANS (ABS): 0 10*3/uL (ref 0.0–0.1)
Immature Granulocytes: 0 %
LYMPHS ABS: 14.6 10*3/uL — AB (ref 0.7–3.1)
LYMPHS: 78 %
MCH: 28.6 pg (ref 26.6–33.0)
MCHC: 32.8 g/dL (ref 31.5–35.7)
MCV: 87 fL (ref 79–97)
MONOCYTES: 3 %
Monocytes Absolute: 0.5 10*3/uL (ref 0.1–0.9)
NEUTROS ABS: 3.5 10*3/uL (ref 1.4–7.0)
Neutrophils: 19 %
Platelets: 250 10*3/uL (ref 150–450)
RBC: 4.86 x10E6/uL (ref 3.77–5.28)
RDW: 13.7 % (ref 12.3–15.4)
WBC: 18.9 10*3/uL — ABNORMAL HIGH (ref 3.4–10.8)

## 2018-05-21 LAB — LIPID PANEL
CHOL/HDL RATIO: 2.4 ratio (ref 0.0–4.4)
CHOLESTEROL TOTAL: 176 mg/dL (ref 100–199)
HDL: 74 mg/dL (ref >39)
LDL Calculated: 89 mg/dL (ref 0–99)
TRIGLYCERIDES: 64 mg/dL (ref 0–149)
VLDL Cholesterol Cal: 13 mg/dL (ref 5–40)

## 2018-05-26 ENCOUNTER — Telehealth: Payer: Self-pay | Admitting: Obstetrics & Gynecology

## 2018-05-26 NOTE — Telephone Encounter (Signed)
Patient returning call to [place]. No open phone note.

## 2018-05-26 NOTE — Telephone Encounter (Signed)
See labs dated 05/20/18.

## 2018-05-26 NOTE — Telephone Encounter (Signed)
Patient returned call, advised of all results. Patient request copy of labs be mailed, confirmed address on file. Patient states she would like to call Scherrie Bateman to discuss f/u recommendations prior to scheduling f/u. Patient request copy of labs be forwarded to [facility] and Dr. Marin Olp. Patient verbalizes understanding and is agreeable.  Copy of labs placed in mail. Labs faxed via Epic to Dr. Marin Olp and [facility].    Encounter closed.

## 2018-06-02 ENCOUNTER — Telehealth: Payer: Self-pay | Admitting: Family

## 2018-06-02 NOTE — Telephone Encounter (Signed)
spoke with pt to sch lab/follow up appt per sch msg. Pt di not want to sch at this time and will call office when she does.

## 2018-06-14 ENCOUNTER — Telehealth: Payer: Self-pay | Admitting: Obstetrics & Gynecology

## 2018-06-14 NOTE — Telephone Encounter (Signed)
Patient has some questions about her recent labs.

## 2018-06-14 NOTE — Telephone Encounter (Signed)
Spoke with patient.   1. Patient states she received copy of labs in mail, did not include Lipid panel. Patient request information on how to sign up for MyChart, directions provided.   2. Patient states Laverna Peace was on vacation, declined to schedule with hematology until she could speak with her directly about recent labs. Patient states she will contact [place]. again on 11/20. Patient thankful for f/u call.   Routing to provider for final review. Patient is agreeable to disposition. Will close encounter.

## 2018-08-18 ENCOUNTER — Other Ambulatory Visit: Payer: Self-pay

## 2018-08-18 ENCOUNTER — Emergency Department (INDEPENDENT_AMBULATORY_CARE_PROVIDER_SITE_OTHER)
Admission: EM | Admit: 2018-08-18 | Discharge: 2018-08-18 | Disposition: A | Discharge status: Home / Self Care | Payer: Commercial Managed Care - PPO | Source: Home / Self Care | Attending: Emergency Medicine | Admitting: Emergency Medicine

## 2018-08-18 ENCOUNTER — Emergency Department (INDEPENDENT_AMBULATORY_CARE_PROVIDER_SITE_OTHER): Payer: Commercial Managed Care - PPO

## 2018-08-18 ENCOUNTER — Encounter: Payer: Self-pay | Admitting: *Deleted

## 2018-08-18 DIAGNOSIS — M79641 Pain in right hand: Secondary | ICD-10-CM | POA: Diagnosis not present

## 2018-08-18 DIAGNOSIS — S161XXA Strain of muscle, fascia and tendon at neck level, initial encounter: Secondary | ICD-10-CM | POA: Diagnosis not present

## 2018-08-18 DIAGNOSIS — M542 Cervicalgia: Secondary | ICD-10-CM

## 2018-08-18 DIAGNOSIS — S66911A Strain of unspecified muscle, fascia and tendon at wrist and hand level, right hand, initial encounter: Secondary | ICD-10-CM | POA: Diagnosis not present

## 2018-08-18 MED ORDER — CYCLOBENZAPRINE HCL 10 MG PO TABS: 10.0000 mg | ORAL_TABLET | Freq: Every day | ORAL | 0 refills | Status: DC
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
## 2018-08-23 ENCOUNTER — Ambulatory Visit (INDEPENDENT_AMBULATORY_CARE_PROVIDER_SITE_OTHER): Payer: Commercial Managed Care - PPO | Admitting: Sports Medicine

## 2018-08-23 ENCOUNTER — Encounter: Payer: Self-pay | Admitting: Sports Medicine

## 2018-08-23 DIAGNOSIS — M5030 Other cervical disc degeneration, unspecified cervical region: Secondary | ICD-10-CM | POA: Diagnosis not present

## 2018-08-23 DIAGNOSIS — M1811 Unilateral primary osteoarthritis of first carpometacarpal joint, right hand: Secondary | ICD-10-CM | POA: Diagnosis not present

## 2018-08-23 MED ORDER — CYCLOBENZAPRINE HCL 10 MG PO TABS: 10.0000 mg | ORAL_TABLET | Freq: Three times a day (TID) | ORAL | 0 refills | Status: DC | PRN

## 2018-08-23 MED ORDER — PREDNISONE 50 MG PO TABS: ORAL_TABLET | ORAL | 0 refills | Status: DC
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
## 2018-08-23 NOTE — Assessment & Plan Note (Signed)
This will probably get better with the prednisone. Physical therapy should work on her thumb as well. If no better in a month we will do an injection.

## 2018-08-24 ENCOUNTER — Ambulatory Visit: Payer: Commercial Managed Care - PPO | Admitting: Obstetrics & Gynecology

## 2018-08-24 ENCOUNTER — Telehealth: Payer: Self-pay | Admitting: *Deleted

## 2018-08-24 ENCOUNTER — Telehealth: Payer: Self-pay | Admitting: Family Medicine

## 2018-08-24 NOTE — Telephone Encounter (Signed)
Letter is in my box 
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
## 2018-08-24 NOTE — Telephone Encounter (Signed)
Patient calling in wanting a note to excuse her from work for yesterday 08/23/18, and the rest of the week. States that this is due to her neck pain which she came in to see Dr.Thekkekandam on Monday. Would like to try and pick up this note tomorrow since she is flying out this week for sons [facility]. Please advise.

## 2018-08-25 ENCOUNTER — Ambulatory Visit: Payer: Commercial Managed Care - PPO | Admitting: Physical Therapy

## 2018-08-25 ENCOUNTER — Encounter: Payer: Self-pay | Admitting: Physical Therapy

## 2018-08-25 ENCOUNTER — Other Ambulatory Visit: Payer: Self-pay

## 2018-08-25 DIAGNOSIS — M542 Cervicalgia: Secondary | ICD-10-CM

## 2018-08-25 DIAGNOSIS — M25541 Pain in joints of right hand: Secondary | ICD-10-CM

## 2018-08-25 DIAGNOSIS — R293 Abnormal posture: Secondary | ICD-10-CM
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
## 2018-08-25 NOTE — Patient Instructions (Signed)
Access Code: [email or web address]  URL: [email or web address]  Date: 08/25/2018  Prepared by: Faustino Congress   Exercises  Seated Gripping Towel - 10 reps - 1 sets - 5 sec hold - 1x daily - 7x weekly  Half Neck Circles - 10 reps - 1 sets - 1x daily - 7x weekly  Seated Cervical Rotation AROM - 10 reps - 1 sets - 3 sec hold - 1x daily - 7x weekly  Patient Education  TENS Unit

## 2018-08-25 NOTE — Telephone Encounter (Signed)
Letter upfront for pick up. Pt advised.

## 2018-09-01 ENCOUNTER — Encounter: Payer: Self-pay | Admitting: Physical Therapy

## 2018-09-01 ENCOUNTER — Ambulatory Visit: Payer: Commercial Managed Care - PPO | Admitting: Physical Therapy

## 2018-09-01 DIAGNOSIS — M25541 Pain in joints of right hand: Secondary | ICD-10-CM

## 2018-09-01 DIAGNOSIS — R293 Abnormal posture: Secondary | ICD-10-CM | POA: Diagnosis not present

## 2018-09-01 DIAGNOSIS — M542 Cervicalgia: Secondary | ICD-10-CM | POA: Diagnosis not present

## 2018-09-01 NOTE — Therapy (Signed)
[place], [place], [address] Phone: [phone]   Fax:  [phone]  Physical Therapy Treatment  Patient Details  Name: Danielle Kim MRN: [id number] Date of Birth: 01/02/1961 Referring Provider (PT): Silverio Decamp, MD   Encounter Date: 09/01/2018  PT End of Session - 09/01/18 1541    Visit Number  2    Number of Visits  12    Date for PT Re-Evaluation  10/06/18    PT Start Time  6269    PT Stop Time  1553    PT Time Calculation (min)  42 min    Activity Tolerance  Patient tolerated treatment well    Behavior During Therapy  [facility] for tasks assessed/performed       Past Medical History:  Diagnosis Date  . Anxiety   . Cluster headaches   . Other specified glaucoma   . Pain around left eye     several bouts  sicne 2011.     Past Surgical History:  Procedure Laterality Date  . AUGMENTATION MAMMAPLASTY  2010  . CERVICAL DISC SURGERY     2001    There were no vitals filed for this visit.  Subjective Assessment - 09/01/18 1512    Subjective  neck is feeling better, c/o stiffness.  reports hand went numb yesterday at work but resolved today.    Pertinent History  glaucoma, anxiety    Limitations  Lifting    Diagnostic tests  xrays: arthritis, negative for fx in hand    Patient Stated Goals  improve movement in Rt hand and neck    Currently in Pain?  Yes    Pain Score  0-No pain    Pain Location  Neck    Pain Onset  1 to 4 weeks ago    Pain Onset  1 to 4 weeks ago         [facility] PT Assessment - 09/01/18 1541      Assessment   Medical Diagnosis  neck pain, Rt hand pain    Referring Provider (PT)  Silverio Decamp, MD    Onset Date/Surgical Date  08/18/18    Hand Dominance  Left    Next MD Visit  1 month      Distraction Test   Findngs  Positive    side  Right    Comment  improved symptoms today with distraction                   [facility] Adult PT  Treatment/Exercise - 09/01/18 1514      Neck Exercises: Machines for Strengthening   UBE (Upper Arm Bike)  L2 x 4 min (2' each direction)      Neck Exercises: Seated   Cervical Rotation  Both;5 reps    Cervical Rotation Limitations  half circles x 5 reps within pain tolerance    Shoulder Rolls  Backwards;15 reps    Other Seated Exercise  bil external rotation x15 reps      Neck Exercises: Supine   Neck Retraction  10 reps;5 secs    Other Supine Exercise  scapular retraction 10 x 5 sec    Other Supine Exercise  shoulder external rotation bil 10 x 5 sec      Modalities   Modalities  Electrical Stimulation;Moist Heat;Traction      Moist Heat Therapy   Number Minutes Moist Heat  15 Minutes    Moist Heat Location  --  upper thoracic     Electrical Stimulation   Electrical Stimulation Location  Rt neck/post shoulder    Electrical Stimulation Action  IFC    Electrical Stimulation Parameters  to tolerance    Electrical Stimulation Goals  Pain;Tone      Traction   Type of Traction  Cervical    Min (lbs)  10    Max (lbs)  15    Hold Time  60    Rest Time  20    Time  15      Neck Exercises: Stretches   Upper Trapezius Stretch  Right;Left;2 reps;30 seconds    Other Neck Stretches  cross chest stretch 2x30 sec                  PT Long Term Goals - 08/25/18 1600      PT LONG TERM GOAL #1   Title  independent with HEP    Status  New    Target Date  10/06/18      PT LONG TERM GOAL #2   Title  improve cervical ROM by 5 degrees each motion for improved function    Status  New    Target Date  10/06/18      PT LONG TERM GOAL #3   Title  FOTO score improved to </= 45% limited     Status  New    Target Date  10/06/18      PT LONG TERM GOAL #4   Title  grip strength improved to at least 25# for improved function    Status  New    Target Date  10/06/18      PT LONG TERM GOAL #5   Title  report pain < 4/10 with ADLs for improved function    Status  New     Target Date  10/06/18            Plan - 09/01/18 1542    Clinical Impression Statement  Pt reporting pain and symptoms are improved but not resolved at this time.  Distraction today improved symptoms of numbness so traction performed today with estim.  Pt progressing well, no goals met at this time.    PT Treatment/Interventions  ADLs/Self Care Home Management;Cryotherapy;Ultrasound;Traction;Moist Heat;Iontophoresis '4mg'$ /ml Dexamethasone;Electrical Stimulation;Neuromuscular re-education;Therapeutic activities;Therapeutic exercise;Patient/family education;Manual techniques;Dry needling;Passive range of motion;Taping    PT Next Visit Plan  assess response to traction, continue posture/ROM exercises, manual/modalities/DN    PT Home Exercise Plan  Access Code: [email or web address]    Consulted and Agree with Plan of Care  Patient       Patient will benefit from skilled therapeutic intervention in order to improve the following deficits and impairments:  Increased muscle spasms, Increased fascial restricitons, Pain, Postural dysfunction, Decreased strength, Decreased range of motion, Impaired tone  Visit Diagnosis: Cervicalgia  Abnormal posture  Pain in joints of right hand     Problem List Patient Active Problem List   Diagnosis Date Noted  . DDD (degenerative disc disease), cervical 08/23/2018  . Primary osteoarthritis of first carpometacarpal joint of one hand, right 08/23/2018  . CLL (chronic lymphocytic leukemia) ([place]) 09/02/2017  . Trigger middle finger of right hand 11/07/2015  . Insomnia 03/31/2014  . Pain around left eye   . Secondary open-angle glaucoma of left eye, severe stage 02/07/2013      Laureen Abrahams, PT, DPT 09/01/18 3:43 PM    [facility] [address], [place], [address] Phone: [phone]  Fax:  [phone]  Name: Danielle Kim MRN: [id number] Date of Birth: 1961-05-02

## 2018-09-06 ENCOUNTER — Ambulatory Visit: Payer: Commercial Managed Care - PPO | Admitting: Family Medicine

## 2018-09-06 ENCOUNTER — Encounter: Payer: Self-pay | Admitting: Family Medicine

## 2018-09-06 VITALS — BP 124/73 | HR 70 | Ht 66.5 in | Wt 153.0 lb

## 2018-09-06 DIAGNOSIS — M5030 Other cervical disc degeneration, unspecified cervical region: Secondary | ICD-10-CM | POA: Diagnosis not present

## 2018-09-06 DIAGNOSIS — M5412 Radiculopathy, cervical region: Secondary | ICD-10-CM

## 2018-09-06 NOTE — Patient Instructions (Signed)
Thank you for coming in today. You should hear from MRI soon.  Let me know if you do not hear anything in a few days.  Recheck after MRI Continue PT and home exercises.  Recheck sooner if needed.    Cervical Radiculopathy  Cervical radiculopathy happens when a nerve in the neck (cervical nerve) is pinched or bruised. This condition can develop because of an injury or as part of the normal aging process. Pressure on the cervical nerves can cause pain or numbness that runs from the neck all the way down into the arm and fingers. Usually, this condition gets better with rest. Treatment may be needed if the condition does not improve. What are the causes? This condition may be caused by:  Injury.  Slipped (herniated) disk.  Muscle tightness in the neck because of overuse.  Arthritis.  Breakdown or degeneration in the bones and joints of the spine (spondylosis) due to aging.  Bone spurs that may develop near the cervical nerves. What are the signs or symptoms? Symptoms of this condition include:  Pain that runs from the neck to the arm and hand. The pain can be severe or irritating. It may be worse when the neck is moved.  Numbness or weakness in the affected arm and hand. How is this diagnosed? This condition may be diagnosed based on symptoms, medical history, and a physical exam. You may also have tests, including:  X-rays.  CT scan.  MRI.  Electromyogram (EMG).  Nerve conduction tests. How is this treated? In many cases, treatment is not needed for this condition. With rest, the condition usually gets better over time. If treatment is needed, options may include:  Wearing a soft neck collar for short periods of time.  Physical therapy to strengthen your neck muscles.  Medicines, such as NSAIDs, oral corticosteroids, or spinal injections.  Surgery. This may be needed if other treatments do not help. Various types of surgery may be done depending on the cause of your  problems. Follow these instructions at home: Managing pain  Take over-the-counter and prescription medicines only as told by your health care provider.  If directed, apply ice to the affected area. ? Put ice in a plastic bag. ? Place a towel between your skin and the bag. ? Leave the ice on for 20 minutes, 2-3 times per day.  If ice does not help, you can try using heat. Take a warm shower or warm bath, or use a heat pack as told by your health care provider.  Try a gentle neck and shoulder massage to help relieve symptoms. Activity  Rest as needed. Follow instructions from your health care provider about any restrictions on activities.  Do stretching and strengthening exercises as told by your health care provider or physical therapist. General instructions  If you were given a soft collar, wear it as told by your health care provider.  Use a flat pillow when you sleep.  Keep all follow-up visits as told by your health care provider. This is important. Contact a health care provider if:  Your condition does not improve with treatment. Get help right away if:  Your pain gets much worse and cannot be controlled with medicines.  You have weakness or numbness in your hand, arm, face, or leg.  You have a high fever.  You have a stiff, rigid neck.  You lose control of your bowels or your bladder (have incontinence).  You have trouble with walking, balance, or speaking. This information  is not intended to replace advice given to you by your health care provider. Make sure you discuss any questions you have with your health care provider. Document Released: 04/08/2001 Document Revised: 12/20/2015 Document Reviewed: 09/07/2014 Elsevier Interactive Patient Education  [employer].
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
## 2018-09-08 ENCOUNTER — Encounter: Payer: Self-pay | Admitting: Physical Therapy

## 2018-09-08 ENCOUNTER — Ambulatory Visit: Payer: Commercial Managed Care - PPO | Admitting: Physical Therapy

## 2018-09-08 DIAGNOSIS — M542 Cervicalgia: Secondary | ICD-10-CM | POA: Diagnosis not present

## 2018-09-08 DIAGNOSIS — M25541 Pain in joints of right hand: Secondary | ICD-10-CM

## 2018-09-08 DIAGNOSIS — R293 Abnormal posture: Secondary | ICD-10-CM

## 2018-09-08 NOTE — Patient Instructions (Addendum)
Scapula Adduction With Pectorals, Low   Stand in doorframe with palms against frame and arms at 45. Lean forward and squeeze shoulder blades. Hold _15-20__ seconds. Repeat __2_ times per session. Do _2__ sessions per day.    Scapula Adduction With Pectorals, Mid-Range   Stand in doorframe with palms against frame and arms at 90. Lean forward and squeeze shoulder blades. Hold _15-20__ seconds. Repeat _2__ times per session. Do __2_ sessions per day.  \Scapula Adduction With Pectorals, High  ONE ARM AT A TIME! Stand in doorframe with palms against frame and arms at 120. Lean forward and squeeze shoulder blades. Hold __15_ seconds. Repeat _2__ times per session. Do _2__ sessions per day.

## 2018-09-08 NOTE — Therapy (Signed)
[place], [place], [address] Phone: [phone]   Fax:  [phone]  Physical Therapy Treatment  Patient Details  Name: Danielle Kim MRN: [id number] Date of Birth: 1960-08-08 Referring Provider (PT): Silverio Decamp, MD   Encounter Date: 09/08/2018  PT End of Session - 09/08/18 1523    Visit Number  3    Number of Visits  12    Date for PT Re-Evaluation  10/06/18    PT Start Time  1520    PT Stop Time  1612    PT Time Calculation (min)  52 min    Activity Tolerance  Patient tolerated treatment well    Behavior During Therapy  [facility] for tasks assessed/performed       Past Medical History:  Diagnosis Date  . Anxiety   . Cluster headaches   . Other specified glaucoma   . Pain around left eye     several bouts  sicne 2011.     Past Surgical History:  Procedure Laterality Date  . AUGMENTATION MAMMAPLASTY  2010  . CERVICAL DISC SURGERY     2001    There were no vitals filed for this visit.  Subjective Assessment - 09/08/18 1523    Subjective  "My wrist and back of Rt hand still hurt if I am using it".  Pt reports her pain in neck is up and down.   She is scheduled to have MRI.      Patient Stated Goals  improve movement in Rt hand and neck    Currently in Pain?  No/denies    Pain Score  0-No pain         [facility] PT Assessment - 09/08/18 0001      Assessment   Medical Diagnosis  neck pain, Rt hand pain    Referring Provider (PT)  Silverio Decamp, MD    Onset Date/Surgical Date  08/18/18    Hand Dominance  Left    Next MD Visit  1 month      AROM   AROM Assessment Site  Cervical    Cervical Flexion  45    Cervical Extension  47    Cervical - Right Side Bend  30    Cervical - Left Side Bend  28    Cervical - Right Rotation  47    Cervical - Left Rotation  55        [facility] Adult PT Treatment/Exercise - 09/08/18 0001      Neck Exercises: Machines for  Strengthening   UBE (Upper Arm Bike)  L1: 1.5 min forward/ 1.5 min backward; standing;  pt reported Rt fingers tingling at end of 3 min       Neck Exercises: Standing   Other Standing Exercises  scap retraction with back against noodle x 5 sec squeeze x 10 reps;  L's and W's with 5 sec hold x 5 reps each       Moist Heat Therapy   Number Minutes Moist Heat  15 Minutes    Moist Heat Location  --   upper thoracic     Traction   Type of Traction  Cervical    Min (lbs)  10    Max (lbs)  15    Hold Time  60    Rest Time  20    Time  19      Neck Exercises: Stretches   Upper Trapezius Stretch  Right;Left;2 reps;20 seconds  Other Neck Stretches  cross chest stretch x 15 sec     Other Neck Stretches  3 position doorway stretch x 20 sec x 2 reps each position;  birdman ulnar nerve stretchx 5 reps with demo and cues             PT Education - 09/08/18 1547    Education Details  HEP - added doorway stretch    Person(s) Educated  Patient    Methods  Explanation;Handout;Demonstration    Comprehension  Verbalized understanding          PT Long Term Goals - 09/08/18 1605      PT LONG TERM GOAL #1   Title  independent with HEP    Status  On-going      PT LONG TERM GOAL #2   Title  improve cervical ROM by 5 degrees each motion for improved function    Status  Achieved      PT LONG TERM GOAL #3   Title  FOTO score improved to </= 45% limited     Status  On-going      PT LONG TERM GOAL #4   Title  grip strength improved to at least 25# for improved function    Status  On-going      PT LONG TERM GOAL #5   Title  report pain < 4/10 with ADLs for improved function    Status  On-going            Plan - 09/08/18 1542    Clinical Impression Statement  Pt demonstrated improved neck ROM since last assessment. She had some increase of symptoms in Rt 4th and 5th finger with UBE, resolved with neck/chest stretches. Pt had positive response to traction last visit; repeated  today without estim, per pt request.   She tolerated all other exercises well.  Pt has metLTG #2, and is progressing towards remaining goals.     Rehab Potential  Good    PT Frequency  2x / week    PT Duration  6 weeks    PT Treatment/Interventions  ADLs/Self Care Home Management;Cryotherapy;Ultrasound;Traction;Moist Heat;Iontophoresis 4mg /ml Dexamethasone;Electrical Stimulation;Neuromuscular re-education;Therapeutic activities;Therapeutic exercise;Patient/family education;Manual techniques;Dry needling;Passive range of motion;Taping    PT Next Visit Plan  continue posture/ROM exercises, manual/modalities/DN    PT Home Exercise Plan  Access Code: [email or web address]    Consulted and Agree with Plan of Care  Patient       Patient will benefit from skilled therapeutic intervention in order to improve the following deficits and impairments:  Increased muscle spasms, Increased fascial restricitons, Pain, Postural dysfunction, Decreased strength, Decreased range of motion, Impaired tone  Visit Diagnosis: Cervicalgia  Abnormal posture  Pain in joints of right hand     Problem List Patient Active Problem List   Diagnosis Date Noted  . DDD (degenerative disc disease), cervical 08/23/2018  . Primary osteoarthritis of first carpometacarpal joint of one hand, right 08/23/2018  . CLL (chronic lymphocytic leukemia) ([place]) 09/02/2017  . Trigger middle finger of right hand 11/07/2015  . Insomnia 03/31/2014  . Pain around left eye   . Secondary open-angle glaucoma of left eye, severe stage 02/07/2013   Kerin Perna, PTA 09/08/18 4:07 PM  [place], [place], [address] Phone: [phone]   Fax:  [phone]  Name: Amarilys Lyles Ahlquist MRN: [id number] Date of Birth: 03-08-1961

## 2018-09-15 ENCOUNTER — Ambulatory Visit: Payer: Commercial Managed Care - PPO | Admitting: Physical Therapy

## 2018-09-15 ENCOUNTER — Encounter: Payer: Commercial Managed Care - PPO | Admitting: Physical Therapy

## 2018-09-15 DIAGNOSIS — M542 Cervicalgia: Secondary | ICD-10-CM | POA: Diagnosis not present

## 2018-09-15 DIAGNOSIS — M25541 Pain in joints of right hand: Secondary | ICD-10-CM | POA: Diagnosis not present

## 2018-09-15 DIAGNOSIS — R293 Abnormal posture: Secondary | ICD-10-CM

## 2018-09-15 NOTE — Patient Instructions (Signed)
Over Head Pull: Narrow Grip     [place] [phone]   On back, knees bent, feet flat, band across thighs, elbows straight but relaxed. Pull hands apart (start). Keeping elbows straight, bring arms up and over head, hands toward floor. Keep pull steady on band. Hold momentarily. Return slowly, keeping pull steady, back to start. Repeat __10_ times. Band color ___yellow ___   Side Pull: Double Arm   On back, knees bent, feet flat. Arms perpendicular to body, shoulder level, elbows straight but relaxed. Pull arms out to sides, elbows straight. Resistance band comes across collarbones, hands toward floor. Hold momentarily. Slowly return to starting position. Repeat _10__ times. Band color __yellow___   Sash   On back, knees bent, feet flat, left hand on left hip, right hand above left. Pull right arm DIAGONALLY (hip to shoulder) across chest. Bring right arm along head toward floor. Hold momentarily. Slowly return to starting position. Repeat __10_ times. Do with left arm. Band color ___yellow__   Shoulder Rotation: Double Arm   On back, knees bent, feet flat, elbows tucked at sides, bent 90, hands palms up. Pull hands apart and down toward floor, keeping elbows near sides. Hold momentarily. Slowly return to starting position. Repeat _10__ times. Band color __yellow____

## 2018-09-15 NOTE — Therapy (Addendum)
[place], [place], [address] Phone: [phone]   Fax:  [phone]  Physical Therapy Treatment/Discharge  Patient Details  Name: Danielle Kim MRN: [id number] Date of Birth: 12-04-1960 Referring Provider (PT): Silverio Decamp, MD   Encounter Date: 09/15/2018  PT End of Session - 09/15/18 1609    Visit Number  4    Number of Visits  12    Date for PT Re-Evaluation  10/06/18    PT Start Time  1606    PT Stop Time  1639    PT Time Calculation (min)  33 min    Activity Tolerance  Patient tolerated treatment well    Behavior During Therapy  [facility] for tasks assessed/performed       Past Medical History:  Diagnosis Date  . Anxiety   . Cluster headaches   . Other specified glaucoma   . Pain around left eye     several bouts  sicne 2011.     Past Surgical History:  Procedure Laterality Date  . AUGMENTATION MAMMAPLASTY  2010  . CERVICAL DISC SURGERY     2001    There were no vitals filed for this visit.  Subjective Assessment - 09/15/18 1609    Subjective  Pt reports her neck and arm are feeling much better.  She is only occasionally having tingling radicular symptoms into Rt hand is she is lifting or pulling something heavy.   She has been doing her doorway stretch daily.     Diagnostic tests  xrays: arthritis, negative for fx in hand    Patient Stated Goals  improve movement in Rt hand and neck    Currently in Pain?  No/denies    Pain Score  0-No pain         [facility] PT Assessment - 09/15/18 0001      Assessment   Medical Diagnosis  neck pain, Rt hand pain    Referring Provider (PT)  Silverio Decamp, MD    Onset Date/Surgical Date  08/18/18    Hand Dominance  Left    Next MD Visit  1 month      Strength   Right Hand Grip (lbs)  34    Left Hand Grip (lbs)  39       [facility] Adult PT Treatment/Exercise - 09/15/18 0001      Neck Exercises: Machines for Strengthening    UBE (Upper Arm Bike)  L1: alternating directions until 1 min 45 seconds.       Neck Exercises: Seated   Other Seated Exercise  thoracic ext over back of chair x 3-5 seconds x 3 reps       Neck Exercises: Supine   Shoulder Flexion  10 reps;Both   overhead pull, yellow band    Other Supine Exercise  with yellow band: bilat horiz abdct x 10 reps, sash each arm x 10; bilat shoulder ER x 10.       Hand Exercises   Other Hand Exercises  Rt/Lt wrist ext/flexion stretches x 15 sec each x 2 reps each      Modalities   Modalities  --   pt declined modalities     Neck Exercises: Stretches   Upper Trapezius Stretch  Right;Left;2 reps;20 seconds    Other Neck Stretches  cross chest stretch x 10 sec     Other Neck Stretches  3 position doorway stretch x 20 sec x 3 reps each position;  PT Education - 09/15/18 1657    Education Details  HEP     Person(s) Educated  Patient    Methods  Explanation;Demonstration;Verbal cues;Handout    Comprehension  Returned demonstration;Verbalized understanding          PT Long Term Goals - 09/15/18 1657      PT LONG TERM GOAL #1   Title  independent with HEP    Status  On-going      PT LONG TERM GOAL #2   Title  improve cervical ROM by 5 degrees each motion for improved function    Status  Achieved      PT LONG TERM GOAL #3   Title  FOTO score improved to </= 45% limited     Status  On-going      PT LONG TERM GOAL #4   Title  grip strength improved to at least 25# for improved function    Status  Achieved      PT LONG TERM GOAL #5   Title  report pain < 4/10 with ADLs for improved function    Status  Achieved            Plan - 09/15/18 1642    Clinical Impression Statement  Pt reporting less frequency of symptoms.  Pt demonstrated improved Rt hand strength (improvement of 19#, now only 5# difference between Lt and Rt hand).  Her neck ROM is much improved from first session.  Pt tolerated supine neck/shoudler  strengthening without increase in symptoms or difficulty.  Pt has partially met her goals.     Rehab Potential  Good    PT Frequency  2x / week    PT Duration  6 weeks    PT Treatment/Interventions  ADLs/Self Care Home Management;Cryotherapy;Ultrasound;Traction;Moist Heat;Iontophoresis '4mg'$ /ml Dexamethasone;Electrical Stimulation;Neuromuscular re-education;Therapeutic activities;Therapeutic exercise;Patient/family education;Manual techniques;Dry needling;Passive range of motion;Taping    PT Next Visit Plan  assess readiness to d/c vs continuation of postural strengthening.     PT Home Exercise Plan  Access Code: [email or web address]    Consulted and Agree with Plan of Care  Patient       Patient will benefit from skilled therapeutic intervention in order to improve the following deficits and impairments:  Increased muscle spasms, Increased fascial restricitons, Pain, Postural dysfunction, Decreased strength, Decreased range of motion, Impaired tone  Visit Diagnosis: Cervicalgia  Abnormal posture  Pain in joints of right hand     Problem List Patient Active Problem List   Diagnosis Date Noted  . DDD (degenerative disc disease), cervical 08/23/2018  . Primary osteoarthritis of first carpometacarpal joint of one hand, right 08/23/2018  . CLL (chronic lymphocytic leukemia) ([place]) 09/02/2017  . Trigger middle finger of right hand 11/07/2015  . Insomnia 03/31/2014  . Pain around left eye   . Secondary open-angle glaucoma of left eye, severe stage 02/07/2013   Kerin Perna, PTA 09/15/18 4:59 PM  [facility] [place], [place], [address] Phone: [phone]   Fax:  [phone]  Name: Danielle Kim MRN: [id number] Date of Birth: 1960-10-08     PHYSICAL THERAPY DISCHARGE SUMMARY  Visits from Start of Care: 4  Current functional level related to goals / functional outcomes: See above; pt canceled PT appts; per  MD notes doing well    Remaining deficits: unknown   Education / Equipment: HEP  Plan: Patient agrees to discharge.  Patient goals were partially met. Patient is being discharged due to being pleased with the current  functional level.  ?????    Laureen Abrahams, PT, DPT 10/18/18 9:13 AM  [facility] [place], [place] [address]  [phone] (office) [phone] (fax)

## 2018-09-22 ENCOUNTER — Ambulatory Visit: Payer: Commercial Managed Care - PPO | Admitting: Sports Medicine

## 2018-09-22 ENCOUNTER — Encounter: Payer: Commercial Managed Care - PPO | Admitting: Physical Therapy

## 2018-09-22 ENCOUNTER — Telehealth: Payer: Self-pay | Admitting: *Deleted

## 2018-09-22 ENCOUNTER — Encounter: Payer: Self-pay | Admitting: Sports Medicine

## 2018-09-22 DIAGNOSIS — M5030 Other cervical disc degeneration, unspecified cervical region: Secondary | ICD-10-CM | POA: Diagnosis not present

## 2018-09-22 DIAGNOSIS — M542 Cervicalgia: Secondary | ICD-10-CM

## 2018-09-22 DIAGNOSIS — R200 Anesthesia of skin: Secondary | ICD-10-CM

## 2018-09-22 NOTE — Telephone Encounter (Signed)
Pt left vm this afternoon to let you know that she wants to go ahead with the MRI for tongue numbness.  She also said that for the last week or so she's been having some bad headaches as well.
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
## 2018-09-23 ENCOUNTER — Telehealth: Payer: Self-pay | Admitting: Sports Medicine

## 2018-09-23 DIAGNOSIS — R200 Anesthesia of skin: Secondary | ICD-10-CM | POA: Insufficient documentation
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
## 2018-09-23 NOTE — Assessment & Plan Note (Signed)
Tongue and palate the numbness. Question lacunar CVA. This is been present for several weeks now. Adding an MRI/MRA of the brain without contrast. If we do see any abnormalities we will proceed with the full CVA work-up including MRA neck, echocardiogram. I will route these results with her PCP for further evaluation and management.

## 2018-09-23 NOTE — Telephone Encounter (Signed)
Imaging orders are in, awaiting insurance approval.

## 2018-09-23 NOTE — Telephone Encounter (Signed)
Pt called this afternoon stating that she is wanting MRI of her head as well as her neck. She left msg for [employer] on 09/22/18 and has not heard back. She has asked if someone could reach out to imaging to get this scheduled. She has asked that someone could call and leave her a message.

## 2018-09-23 NOTE — Telephone Encounter (Signed)
Orders placed for brain MRI without contrast, cervical spine MRI without contrast.

## 2018-09-24 ENCOUNTER — Ambulatory Visit (HOSPITAL_BASED_OUTPATIENT_CLINIC_OR_DEPARTMENT_OTHER): Payer: Commercial Managed Care - PPO

## 2018-09-25 ENCOUNTER — Ambulatory Visit (HOSPITAL_BASED_OUTPATIENT_CLINIC_OR_DEPARTMENT_OTHER): Payer: Commercial Managed Care - PPO

## 2018-10-03 ENCOUNTER — Ambulatory Visit
Admission: RE | Admit: 2018-10-03 | Discharge: 2018-10-03 | Disposition: A | Discharge status: Home / Self Care | Payer: Commercial Managed Care - PPO | Source: Ambulatory Visit | Attending: Sports Medicine | Admitting: Sports Medicine

## 2018-10-03 MED ORDER — GADOBENATE DIMEGLUMINE 529 MG/ML IV SOLN
13.0000 mL | Freq: Once | INTRAVENOUS | Status: AC | PRN
  Administered 2018-10-03: 13 mL via INTRAVENOUS

## 2018-10-04 ENCOUNTER — Ambulatory Visit
Admission: RE | Admit: 2018-10-04 | Discharge: 2018-10-04 | Disposition: A | Discharge status: Home / Self Care | Payer: Commercial Managed Care - PPO | Source: Ambulatory Visit | Attending: Sports Medicine | Admitting: Sports Medicine

## 2018-10-04 ENCOUNTER — Other Ambulatory Visit: Payer: Self-pay | Admitting: Sports Medicine

## 2018-10-04 DIAGNOSIS — M5030 Other cervical disc degeneration, unspecified cervical region: Secondary | ICD-10-CM

## 2018-10-04 DIAGNOSIS — M542 Cervicalgia: Secondary | ICD-10-CM

## 2018-10-04 DIAGNOSIS — R200 Anesthesia of skin: Secondary | ICD-10-CM

## 2018-10-11 ENCOUNTER — Other Ambulatory Visit: Payer: Commercial Managed Care - PPO

## 2018-12-01 ENCOUNTER — Other Ambulatory Visit: Payer: Self-pay | Admitting: Obstetrics & Gynecology

## 2018-12-01 DIAGNOSIS — Z1231 Encounter for screening mammogram for malignant neoplasm of breast: Secondary | ICD-10-CM

## 2019-01-26 ENCOUNTER — Ambulatory Visit: Payer: Commercial Managed Care - PPO

## 2019-03-01 ENCOUNTER — Telehealth: Payer: Self-pay | Admitting: Obstetrics & Gynecology

## 2019-03-01 NOTE — Telephone Encounter (Signed)
There are four female providers with [facility] at [facility].  The only one I really know is Dr. Madilyn Fireman but she is not taking new patients.  It would be a place I would consider as they use the same EMR so can see her Epic records but I don't have a personal reference to make about any of the providers because I just don't know any of them.  I'm sorry.  Hope that helps a little.

## 2019-03-01 NOTE — Telephone Encounter (Signed)
Patient is calling regarding who Dr. Sabra Heck recommends as a female PCP in [place].

## 2019-03-01 NOTE — Telephone Encounter (Signed)
Spoke with patient, advised as seen below per Dr. Sabra Heck. Patient thankful for return call, she will contact their office directly. Questions answered.   Encounter closed.

## 2019-03-01 NOTE — Telephone Encounter (Signed)
Routing to Dr. Miller to advise

## 2019-03-21 ENCOUNTER — Ambulatory Visit (INDEPENDENT_AMBULATORY_CARE_PROVIDER_SITE_OTHER): Payer: Commercial Managed Care - PPO | Admitting: Osteopathic Medicine

## 2019-03-21 ENCOUNTER — Telehealth: Payer: Self-pay | Admitting: Family Medicine

## 2019-03-21 ENCOUNTER — Encounter: Payer: Self-pay | Admitting: Osteopathic Medicine

## 2019-03-21 VITALS — Temp 97.2°F | Ht 66.0 in | Wt 159.0 lb

## 2019-03-21 DIAGNOSIS — Z856 Personal history of leukemia: Secondary | ICD-10-CM | POA: Diagnosis not present

## 2019-03-21 DIAGNOSIS — Z0000 Encounter for general adult medical examination without abnormal findings: Secondary | ICD-10-CM

## 2019-03-21 DIAGNOSIS — Z87891 Personal history of nicotine dependence: Secondary | ICD-10-CM | POA: Diagnosis not present

## 2019-03-21 DIAGNOSIS — R635 Abnormal weight gain: Secondary | ICD-10-CM | POA: Diagnosis not present

## 2019-03-21 DIAGNOSIS — H4052X3 Glaucoma secondary to other eye disorders, left eye, severe stage: Secondary | ICD-10-CM

## 2019-03-21 NOTE — Progress Notes (Signed)
Virtual Visit via Video (App used: Doximity) Note  I connected with      Danielle Kim on 03/21/19 at 11:20 AM by a telemedicine application and verified that I am speaking with the correct person using two identifiers.  Patient is at home I am in office    I discussed the limitations of evaluation and management by telemedicine and the availability of in person appointments. The patient expressed understanding and agreed to proceed.  History of Present Illness: Danielle Kim is a 58 y.o. female who would like to discuss new to establish, concerns w/ weight gain    Pleasant patient to establish care. Works as a Database administrator, stressful job, on the phone a lot. Partnered, previously divorced. Had 3 adult children, one of whom died last winter and she has been adjusting to the new normal without him. Initially wan't eating a lost weight, more recently has been overeating / stress eating.   Has been getting labs form OBGYN, woud like to establihs w/ primary care.   Previously diagnosed w/ CLL.   Glaucoma only on L, hx cluster headaches when she wasn't on the glaucoma medication.   Former smoker. Quit 6 years ago, pack per week for about 30 years off and on.     Observations/Objective: Temp (!) 97.2 F (36.2 C) (Oral)   Ht 5\' 6"  (1.676 m)   Wt 159 lb (72.1 kg)   LMP 04/28/2015   BMI 25.66 kg/m  BP Readings from Last 3 Encounters:  09/22/18 124/82  09/06/18 124/73  08/23/18 114/77   Exam: Normal Speech.  NAD  Lab and Radiology Results No results found for this or any previous visit (from the past 72 hour(s)). No results found.     Assessment and Plan: 58 y.o. female with The primary encounter diagnosis was Personal history of CLL (chronic lymphocytic leukemia). Diagnoses of Former smoker, Secondary open-angle glaucoma of left eye, severe stage, Abnormal weight gain, and Annual physical exam were also pertinent to this visit.  Labs ordered for future visit.  Annual physical / preventive care was NOT performed or billed today.   PDMP not reviewed this encounter. Orders Placed This Encounter  Procedures  . CBC (INCLUDES DIFF/PLT) WITH PATHOLOGIST REVIEW  . COMPLETE METABOLIC PANEL WITH GFR  . Lipid panel  . TSH   No orders of the defined types were placed in this encounter.  Discussed weight gain, options for medications. Pt will do some research and we will talk more at her annual.     Follow Up Instructions: Return for ANNUAL (labs prior to visit, orders are in) next couple weeks in office .    I discussed the assessment and treatment plan with the patient. The patient was provided an opportunity to ask questions and all were answered. The patient agreed with the plan and demonstrated an understanding of the instructions.   The patient was advised to call back or seek an in-person evaluation if any new concerns, if symptoms worsen or if the condition fails to improve as anticipated.  30 minutes of non-face-to-face time was provided during this encounter.                      Historical information moved to improve visibility of documentation.  Past Medical History:  Diagnosis Date  . Anxiety   . Cluster headaches   . Other specified glaucoma   . Pain around left eye     several bouts  sicne 2011.    Past Surgical History:  Procedure Laterality Date  . AUGMENTATION MAMMAPLASTY  2010  . CERVICAL DISC SURGERY     2001   Social History   Tobacco Use  . Smoking status: Former Smoker    Types: Cigarettes  . Smokeless tobacco: Never Used  . Tobacco comment: quit 9 months ago  Substance Use Topics  . Alcohol use: Yes    Alcohol/week: 2.0 standard drinks    Types: 2 Standard drinks or equivalent per week   family history includes Diabetes in her father; Down syndrome in her sister; Heart attack in her mother; Stomach cancer in her maternal aunt; Stroke in her mother.  Medications: Current Outpatient  Medications  Medication Sig Dispense Refill  . timolol (BETIMOL) 0.25 % ophthalmic solution 1-2 drops 2 (two) times daily.    . TRAVATAN Z 0.004 % SOLN ophthalmic solution PLACE 1 DROP INTO THE LEFT EYE NIGHTLY  5  . cyclobenzaprine (FLEXERIL) 10 MG tablet Take 1 tablet (10 mg total) by mouth 3 (three) times daily as needed for muscle spasms. For muscle relaxant.  Caution, may cause drowsiness. (Patient not taking: Reported on 03/21/2019) 90 tablet 0  . hydrOXYzine (ATARAX/VISTARIL) 25 MG tablet     . phenazopyridine (PYRIDIUM) 200 MG tablet      No current facility-administered medications for this visit.    No Known Allergies  PDMP not reviewed this encounter. No orders of the defined types were placed in this encounter.  No orders of the defined types were placed in this encounter.

## 2019-03-21 NOTE — Telephone Encounter (Signed)
-----   Message from Emeterio Reeve, DO sent at 03/21/2019 11:55 AM EDT ----- Can we reschedule her physical from tomorrow to early on next Tues or Thurs? 09/01 at 7:30 or 09/03 7:10 or 7:30 would be ok. Or whatever other slot for physical if patient wants. Thanks!

## 2019-03-21 NOTE — Telephone Encounter (Signed)
Left patient a voicemail with information below. Let patient know to call us back to reschedule the appointment.

## 2019-03-22 ENCOUNTER — Encounter: Payer: Commercial Managed Care - PPO | Admitting: Osteopathic Medicine

## 2019-03-23 LAB — COMPLETE METABOLIC PANEL WITH GFR
AG Ratio: 1.8 (calc) (ref 1.0–2.5)
ALT: 25 U/L (ref 6–29)
AST: 23 U/L (ref 10–35)
Albumin: 4.4 g/dL (ref 3.6–5.1)
Alkaline phosphatase (APISO): 89 U/L (ref 37–153)
BUN: 13 mg/dL (ref 7–25)
CO2: 29 mmol/L (ref 20–32)
Calcium: 9.2 mg/dL (ref 8.6–10.4)
Chloride: 103 mmol/L (ref 98–110)
Creat: 0.75 mg/dL (ref 0.50–1.05)
GFR, Est African American: 103 mL/min/{1.73_m2} (ref >=60)
GFR, Est Non African American: 88 mL/min/{1.73_m2} (ref >=60)
Globulin: 2.5 g/dL (calc) (ref 1.9–3.7)
Glucose, Bld: 97 mg/dL (ref 65–99)
Potassium: 4.7 mmol/L (ref 3.5–5.3)
Sodium: 140 mmol/L (ref 135–146)
Total Bilirubin: 0.5 mg/dL (ref 0.2–1.2)
Total Protein: 6.9 g/dL (ref 6.1–8.1)

## 2019-03-23 LAB — CBC (INCLUDES DIFF/PLT) WITH PATHOLOGIST REVIEW
Absolute Monocytes: 579 cells/uL (ref 200–950)
Basophils Absolute: 58 cells/uL (ref 0–200)
Basophils Relative: 0.3 %
Eosinophils Absolute: 135 cells/uL (ref 15–500)
Eosinophils Relative: 0.7 %
HCT: 41.2 % (ref 35.0–45.0)
Hemoglobin: 13.5 g/dL (ref 11.7–15.5)
Lymphs Abs: 16038 cells/uL — ABNORMAL HIGH (ref 850–3900)
MCH: 29.6 pg (ref 27.0–33.0)
MCHC: 32.8 g/dL (ref 32.0–36.0)
MCV: 90.4 fL (ref 80.0–100.0)
MPV: 11.1 fL (ref 7.5–12.5)
Monocytes Relative: 3 %
Neutro Abs: 2490 cells/uL (ref 1500–7800)
Neutrophils Relative %: 12.9 %
Platelets: 236 10*3/uL (ref 140–400)
RBC: 4.56 10*6/uL (ref 3.80–5.10)
RDW: 13.7 % (ref 11.0–15.0)
Total Lymphocyte: 83.1 %
WBC: 19.3 10*3/uL — ABNORMAL HIGH (ref 3.8–10.8)

## 2019-03-23 LAB — LIPID PANEL
Cholesterol: 189 mg/dL (ref <200)
HDL: 64 mg/dL (ref >=50)
LDL Cholesterol (Calc): 109 mg/dL (calc) — ABNORMAL HIGH
Non-HDL Cholesterol (Calc): 125 mg/dL (calc) (ref <130)
Total CHOL/HDL Ratio: 3 (calc) (ref <5.0)
Triglycerides: 70 mg/dL (ref <150)

## 2019-03-23 LAB — TSH: TSH: 1.25 mIU/L (ref 0.40–4.50)

## 2019-03-31 ENCOUNTER — Encounter: Payer: Self-pay | Admitting: Osteopathic Medicine

## 2019-03-31 ENCOUNTER — Other Ambulatory Visit: Payer: Self-pay

## 2019-03-31 ENCOUNTER — Ambulatory Visit (INDEPENDENT_AMBULATORY_CARE_PROVIDER_SITE_OTHER): Payer: Commercial Managed Care - PPO | Admitting: Osteopathic Medicine

## 2019-03-31 VITALS — BP 129/85 | HR 56 | Temp 98.4°F | Wt 162.2 lb

## 2019-03-31 DIAGNOSIS — Z23 Encounter for immunization: Secondary | ICD-10-CM

## 2019-03-31 DIAGNOSIS — Z0000 Encounter for general adult medical examination without abnormal findings: Secondary | ICD-10-CM | POA: Diagnosis not present

## 2019-03-31 DIAGNOSIS — R635 Abnormal weight gain: Secondary | ICD-10-CM

## 2019-03-31 DIAGNOSIS — R399 Unspecified symptoms and signs involving the genitourinary system: Secondary | ICD-10-CM | POA: Diagnosis not present

## 2019-03-31 LAB — POCT URINALYSIS DIPSTICK
Bilirubin, UA: NEGATIVE
Blood, UA: NEGATIVE
Glucose, UA: NEGATIVE
Ketones, UA: NEGATIVE
Leukocytes, UA: NEGATIVE
Nitrite, UA: NEGATIVE
Protein, UA: NEGATIVE
Spec Grav, UA: 1.01 (ref 1.010–1.025)
Urobilinogen, UA: 0.2 E.U./dL
pH, UA: 7 (ref 5.0–8.0)

## 2019-03-31 MED ORDER — NITROFURANTOIN MONOHYD MACRO 100 MG PO CAPS: 100.0000 mg | ORAL_CAPSULE | Freq: Two times a day (BID) | ORAL | 0 refills | Status: DC

## 2019-03-31 MED ORDER — NALTREXONE-BUPROPION HCL ER 8-90 MG PO TB12: ORAL_TABLET | ORAL | 0 refills | Status: DC

## 2019-03-31 NOTE — Progress Notes (Signed)
HPI: Danielle Kim is a 58 y.o. female who  has a past medical history of Anxiety, CLL (chronic lymphocytic leukemia) ([place]), Cluster headaches, Other specified glaucoma, and Pain around left eye.  she presents to [facility] today, 03/31/19,  for chief complaint of: Annual Physical  UTI symptoms  Weight     Patient here for annual physical / wellness exam.  See preventive care reviewed as below.  Recent labs reviewed in detail with the patient.   Additional concerns today include:   UTI symptoms: dysuria, just wanted to get this checked out today   CLL: last instructions from oncology 05/2017 recommended follow-up in 6 mos. Pt was called 05/2018 and declined appointment.   Would like to start Contrave for help with weight loss      Past medical, surgical, social and family history reviewed:  Patient Active Problem List   Diagnosis Date Noted  . Numbness around mouth 09/23/2018  . DDD (degenerative disc disease), cervical 08/23/2018  . Primary osteoarthritis of first carpometacarpal joint of one hand, right 08/23/2018  . CLL (chronic lymphocytic leukemia) ([place]) 09/02/2017  . Trigger middle finger of right hand 11/07/2015  . Insomnia 03/31/2014  . Pain around left eye   . Secondary open-angle glaucoma of left eye, severe stage 02/07/2013    Past Surgical History:  Procedure Laterality Date  . AUGMENTATION MAMMAPLASTY  2010   Mini-lift in 01/2019  . CERVICAL DISC SURGERY     2001    Social History   Tobacco Use  . Smoking status: Former Smoker    Types: Cigarettes  . Smokeless tobacco: Never Used  . Tobacco comment: quit 9 months ago  Substance Use Topics  . Alcohol use: Yes    Alcohol/week: 2.0 standard drinks    Types: 2 Standard drinks or equivalent per week    Family History  Problem Relation Age of Onset  . Diabetes Father   . Stroke Mother   . Heart attack Mother   . Stomach cancer Maternal Aunt   . Down  syndrome Sister   . Colon cancer Neg Hx      Current medication list and allergy/intolerance information reviewed:    Current Meds  Medication Sig  . timolol (BETIMOL) 0.25 % ophthalmic solution 1-2 drops 2 (two) times daily.  . TRAVATAN Z 0.004 % SOLN ophthalmic solution PLACE 1 DROP INTO THE LEFT EYE NIGHTLY     No Known Allergies    Review of Systems:  Constitutional:  No  fever, no chills, No recent illness, No unintentional weight changes. No significant fatigue.   HEENT: No  headache, no vision change, no hearing change, No sore throat, No  sinus pressure  Cardiac: No  chest pain, No  pressure, No palpitations, No  Orthopnea  Respiratory:  No  shortness of breath. No  Cough  Gastrointestinal: No  abdominal pain, No  nausea, No  vomiting,  No  blood in stool, No  diarrhea, No  constipation   Musculoskeletal: No new myalgia/arthralgia  Skin: No  Rash, No other wounds/concerning lesions  Genitourinary: No  incontinence, No  abnormal genital bleeding, No abnormal genital discharge, +dysuria x2 days   Hem/Onc: No  easy bruising/bleeding, No  abnormal lymph node  Endocrine: No cold intolerance,  No heat intolerance. No polyuria/polydipsia/polyphagia   Neurologic: No  weakness, No  dizziness, No  slurred speech/focal weakness/facial droop  Psychiatric: No  concerns with depression, +concerns with anxiety, No sleep problems, No mood problems  Exam:  BP 129/85 (BP Location: Left Arm, Patient Position: Sitting, Cuff Size: Normal)   Pulse (!) 56   Temp 98.4 F (36.9 C) (Oral)   Wt 162 lb 3.2 oz (73.6 kg)   LMP 04/28/2015   BMI 26.18 kg/m   Constitutional: VS see above. General Appearance: alert, well-developed, well-nourished, NAD  Eyes: Normal lids and conjunctive, non-icteric sclera  Ears, Nose, Mouth, Throat: TM normal bilaterally.   Neck: No masses, trachea midline. No thyroid enlargement. No tenderness/mass appreciated. No lymphadenopathy  Respiratory:  Normal respiratory effort. no wheeze, no rhonchi, no rales  Cardiovascular: S1/S2 normal, no murmur, no rub/gallop auscultated. RRR. No lower extremity edema.   Gastrointestinal: Nontender, no masses. No hepatomegaly, no splenomegaly. No hernia appreciated. Bowel sounds normal. Rectal exam deferred.   Musculoskeletal: Gait normal. No clubbing/cyanosis of digits.   Neurological: Normal balance/coordination. No tremor. No cranial nerve deficit on limited exam. Motor and sensation intact and symmetric. Cerebellar reflexes intact.   Skin: warm, dry, intact. No rash/ulcer. No concerning nevi or subq nodules on limited exam.    Psychiatric: Normal judgment/insight. Normal mood and affect. Oriented x3.    Results for orders placed or performed in visit on 03/31/19 (from the past 72 hour(s))  POCT Urinalysis Dipstick     Status: None   Collection Time: 03/31/19  7:51 AM  Result Value Ref Range   Color, UA YELLOW    Clarity, UA CLEAR    Glucose, UA Negative Negative   Bilirubin, UA Negative    Ketones, UA Negative    Spec Grav, UA 1.010 1.010 - 1.025   Blood, UA Negative    pH, UA 7.0 5.0 - 8.0   Protein, UA Negative Negative   Urobilinogen, UA 0.2 0.2 or 1.0 E.U./dL   Nitrite, UA Negative    Leukocytes, UA Negative Negative   Appearance     Odor        ASSESSMENT/PLAN: The primary encounter diagnosis was Annual physical exam. Diagnoses of Need for Tdap vaccination, Need for influenza vaccination, UTI symptoms, and Abnormal weight gain were also pertinent to this visit.   Orders Placed This Encounter  Procedures  . Urine Culture  . Tdap vaccine greater than or equal to 7yo IM  . Flu Vaccine QUAD 6+ mos PF IM (Fluarix Quad PF)  . Urinalysis, microscopic only  . POCT Urinalysis Dipstick   The 10-year ASCVD risk score Mikey Bussing DC Jr., et al., 2013) is: 2.1%   Values used to calculate the score:     Age: 21 years     Sex: Female     Is Non-Hispanic African American: No      Diabetic: No     Tobacco smoker: No     Systolic Blood Pressure: [id number] mmHg     Is BP treated: No     HDL Cholesterol: 64 mg/dL     Total Cholesterol: 189 mg/dL   Meds ordered this encounter  Medications  . Naltrexone-buPROPion HCl ER 8-90 MG TB12    Sig: 1 tab daily for week 1, then 1 tab BID for week 2, then 2 tab PO qAM and 1 tab PO qPM for week 3, then 2 tabs BID.    Dispense:  80 tablet    Refill:  0  . nitrofurantoin, macrocrystal-monohydrate, (MACROBID) 100 MG capsule    Sig: Take 1 capsule (100 mg total) by mouth 2 (two) times daily.    Dispense:  10 capsule    Refill:  0  Patient Instructions  General Preventive Care  Most recent routine screening lipids/other labs: already done!   Everyone should have blood pressure checked once per year. Goal 130/80 or less.   Tobacco: don't!   Alcohol: responsible moderation is ok for most adults - if you have concerns about your alcohol intake, please talk to me!   Exercise: as tolerated to reduce risk of cardiovascular disease and diabetes. Strength training will also prevent osteoporosis.   Mental health: if need for mental health care (medicines, counseling, other), or concerns about moods, please let me know!   Sexual health: if need for STD testing, or if concerns with libido/pain problems, please let me know!   Advanced Directive: Living Will and/or Healthcare Power of Attorney recommended for all adults, regardless of age or health.  Vaccines  Flu vaccine: recommended for almost everyone, every fall.   Shingles vaccine: Shingrix recommended after age 78. Will get 2nd shot records.   Pneumonia vaccines: Prevnar and Pneumovax recommended after age 77, or sooner if certain medical conditions.  Tetanus booster: Tdap recommended every 10 years. Updated today.  Cancer screenings   Colon cancer screening: Due 08/2023  Breast cancer screening: mammogram recommended annually after age 2.   Cervical cancer screening:  per OBGYN  Lung cancer screening: not needed  Infection screenings . HIV, Gonorrhea/Chlamydia: screening as needed. . Hepatitis C: recommended for anyone born 67-1965 . TB: certain at-risk populations, or depending on work requirements and/or travel history Other . Bone Density Test: recommended for women at age 30, sooner depending on risk factors       Visit summary with medication list and pertinent instructions was printed for patient to review. All questions at time of visit were answered - patient instructed to contact office with any additional concerns or updates. ER/RTC precautions were reviewed with the patient.     Please note: voice recognition software was used to produce this document, and typos may escape review. Please contact Dr. Sheppard Coil for any needed clarifications.     Follow-up plan: Return in about 3 months (around 06/30/2019) for weight check on Contrave. See me sooner if needed! Marland Kitchen

## 2019-03-31 NOTE — Patient Instructions (Addendum)
General Preventive Care  Most recent routine screening lipids/other labs: already done!   Everyone should have blood pressure checked once per year. Goal 130/80 or less.   Tobacco: don't!   Alcohol: responsible moderation is ok for most adults - if you have concerns about your alcohol intake, please talk to me!   Exercise: as tolerated to reduce risk of cardiovascular disease and diabetes. Strength training will also prevent osteoporosis.   Mental health: if need for mental health care (medicines, counseling, other), or concerns about moods, please let me know!   Sexual health: if need for STD testing, or if concerns with libido/pain problems, please let me know!   Advanced Directive: Living Will and/or Healthcare Power of Attorney recommended for all adults, regardless of age or health.  Vaccines  Flu vaccine: recommended for almost everyone, every fall.   Shingles vaccine: Shingrix recommended after age 59. Will get 2nd shot records.   Pneumonia vaccines: Prevnar and Pneumovax recommended after age 37, or sooner if certain medical conditions.  Tetanus booster: Tdap recommended every 10 years. Updated today.  Cancer screenings   Colon cancer screening: Due 08/2023  Breast cancer screening: mammogram recommended annually after age 79.   Cervical cancer screening: per OBGYN  Lung cancer screening: not needed  Infection screenings . HIV, Gonorrhea/Chlamydia: screening as needed. . Hepatitis C: recommended for anyone born 24-1965 . TB: certain at-risk populations, or depending on work requirements and/or travel history Other . Bone Density Test: recommended for women at age 80, sooner depending on risk factors

## 2019-04-02 LAB — URINE CULTURE
MICRO NUMBER:: 845418
Result:: NO GROWTH
SPECIMEN QUALITY:: ADEQUATE

## 2019-04-02 LAB — URINALYSIS, MICROSCOPIC ONLY
Bacteria, UA: NONE SEEN /HPF
Hyaline Cast: NONE SEEN /LPF
RBC / HPF: NONE SEEN /HPF (ref 0–2)
Squamous Epithelial / HPF: NONE SEEN /HPF (ref <=5)
WBC, UA: NONE SEEN /HPF (ref 0–5)

## 2019-04-07 ENCOUNTER — Ambulatory Visit: Payer: Commercial Managed Care - PPO | Admitting: Obstetrics & Gynecology

## 2019-04-07 ENCOUNTER — Other Ambulatory Visit: Payer: Self-pay

## 2019-04-07 ENCOUNTER — Encounter: Payer: Self-pay | Admitting: Obstetrics & Gynecology

## 2019-04-07 VITALS — BP 120/84 | HR 64 | Temp 97.5°F | Ht 66.0 in | Wt 160.2 lb

## 2019-04-07 DIAGNOSIS — F4320 Adjustment disorder, unspecified: Secondary | ICD-10-CM

## 2019-04-07 DIAGNOSIS — N949 Unspecified condition associated with female genital organs and menstrual cycle: Secondary | ICD-10-CM | POA: Diagnosis not present

## 2019-04-07 DIAGNOSIS — F4321 Adjustment disorder with depressed mood: Secondary | ICD-10-CM | POA: Diagnosis not present

## 2019-04-07 DIAGNOSIS — N9489 Other specified conditions associated with female genital organs and menstrual cycle: Secondary | ICD-10-CM

## 2019-04-07 MED ORDER — FLUCONAZOLE 150 MG PO TABS: 150.0000 mg | ORAL_TABLET | Freq: Once | ORAL | 0 refills | Status: AC

## 2019-04-07 MED ORDER — METRONIDAZOLE 0.75 % VA GEL: 1.0000 | Freq: Every day | VAGINAL | 0 refills | Status: DC

## 2019-04-07 NOTE — Progress Notes (Signed)
GYNECOLOGY  VISIT  CC:   Vaginal irritation, urgency, pressure, x 3 weeks   HPI: 58 y.o. G3P3 Divorced White or Caucasian female here for vaginal irritation that has been present about three weeks.  She is having some urinary pressure and some urinary urgency.  Urine culture was negative 03/31/2019 with Dr. Sheppard Coil and she was given macrobid.  This didn't really help.  Has not tried any OTC products.  She did take AZO and this didn't help.    Denies vaginal bleeding.  Denies vaginal discharge.  She just has irritation.  Son died in September 16, 2022 after doing cocaine that was laced with fentanyl.  She and significant other are struggling with their relationship.  She feels she is doing as well as possible as this point in the grief process.  Is very tearful today.  Is back at work.  Of course, worried about other two children.  Daughter had to call her and advised of death as she was in [place] at the time.  Was just a horrible trip home.  She felt she cried the entire trip.  Was there is significant other to scatter his mother's ashes so she traveled back to [place] alone.  GYNECOLOGIC HISTORY: Patient's last menstrual period was 04/28/2015. Contraception: PMP Menopausal hormone therapy: none  Patient Active Problem List   Diagnosis Date Noted  . Numbness around mouth 09/23/2018  . DDD (degenerative disc disease), cervical 08/23/2018  . Primary osteoarthritis of first carpometacarpal joint of one hand, right 08/23/2018  . CLL (chronic lymphocytic leukemia) ([place]) 09/02/2017  . Trigger middle finger of right hand 11/07/2015  . Insomnia 03/31/2014  . Pain around left eye   . Secondary open-angle glaucoma of left eye, severe stage 02/07/2013    Past Medical History:  Diagnosis Date  . Anxiety   . CLL (chronic lymphocytic leukemia) ([place])   . Cluster headaches   . Other specified glaucoma   . Pain around left eye     several bouts  sicne 2011.     Past Surgical History:  Procedure  Laterality Date  . AUGMENTATION MAMMAPLASTY  2010   Mini-lift in 01/2019  . CERVICAL DISC SURGERY     2001    MEDS:   Current Outpatient Medications on File Prior to Visit  Medication Sig Dispense Refill  . timolol (BETIMOL) 0.25 % ophthalmic solution 1-2 drops 2 (two) times daily.    . TRAVATAN Z 0.004 % SOLN ophthalmic solution PLACE 1 DROP INTO THE LEFT EYE NIGHTLY  5   No current facility-administered medications on file prior to visit.     ALLERGIES: Patient has no known allergies.  Family History  Problem Relation Age of Onset  . Diabetes Father   . Stroke Mother   . Heart attack Mother   . Stomach cancer Maternal Aunt   . Down syndrome Sister   . Colon cancer Neg Hx     SH:  Divorced, former smoker  Review of Systems  Genitourinary: Positive for urgency.       Irritation Pressure   All other systems reviewed and are negative.   PHYSICAL EXAMINATION:    BP 120/84   Pulse 64   Temp (!) 97.5 F (36.4 C) (Temporal)   Ht 5\' 6"  (1.676 m)   Wt 160 lb 3.2 oz (72.7 kg)   LMP 04/28/2015   BMI 25.86 kg/m     General appearance: alert, cooperative and appears stated age Abdomen: soft, non-tender; bowel sounds normal; no masses,  no organomegaly Lymph:  no inguinal LAD noted  Pelvic: External genitalia:  no lesions              Urethra:  normal appearing urethra with no masses, tenderness or lesions              Bartholins and Skenes: normal                 Vagina: vaginal erythema without discharge, no lesions              Cervix: absent              Bimanual Exam:  Uterus:  uterus absent              Adnexa: no mass, fullness, tenderness              Anus: no lesions  Chaperone was present for exam.  Assessment: Vulvar irritation Grief reaction  Plan: Affirm pending.  Results will be called to pt.   ~30 minutes spent with patient >50% of time was in face to face discussion of above.

## 2019-04-08 LAB — VAGINITIS/VAGINOSIS, DNA PROBE
Candida Species: NEGATIVE
Gardnerella vaginalis: POSITIVE — AB
Trichomonas vaginosis: NEGATIVE

## 2019-04-15 ENCOUNTER — Other Ambulatory Visit: Payer: Self-pay

## 2019-04-15 ENCOUNTER — Ambulatory Visit: Payer: Commercial Managed Care - PPO | Admitting: Obstetrics & Gynecology

## 2019-04-15 ENCOUNTER — Encounter: Payer: Self-pay | Admitting: Obstetrics & Gynecology

## 2019-04-15 ENCOUNTER — Telehealth: Payer: Self-pay | Admitting: Obstetrics & Gynecology

## 2019-04-15 VITALS — BP 138/82 | HR 68 | Temp 97.6°F | Ht 66.0 in | Wt 162.0 lb

## 2019-04-15 DIAGNOSIS — R102 Pelvic and perineal pain: Secondary | ICD-10-CM

## 2019-04-15 LAB — POCT URINALYSIS DIPSTICK
Bilirubin, UA: NEGATIVE
Glucose, UA: NEGATIVE
Ketones, UA: NEGATIVE
Leukocytes, UA: NEGATIVE
Nitrite, UA: NEGATIVE
Protein, UA: NEGATIVE
Urobilinogen, UA: 0.2 E.U./dL
pH, UA: 7 (ref 5.0–8.0)

## 2019-04-15 MED ORDER — SULFAMETHOXAZOLE-TRIMETHOPRIM 800-160 MG PO TABS: 1.0000 | ORAL_TABLET | Freq: Two times a day (BID) | ORAL | 0 refills | Status: DC

## 2019-04-15 NOTE — Telephone Encounter (Signed)
Spoke with patient. Seen in office on 9/10, prescribed diflucan and metrogel. Vaginitis positive for BV. Patient states she wanted to "knock out the BV, was leaving for the beach", used metrogel bid for 4 days, completed last dose last night. Symptoms were better on 9/16, but have since returned "as bad as they were when I was in the office". Reports pelvic pressure, only gets relief when stanidng. Denies vaginal d/c or odor. Always has urinary frequency, drinks a lot of water, denies any other urinary symptoms.   Advised I will review with Dr. Sabra Heck and return call, patient agreeable.

## 2019-04-15 NOTE — Telephone Encounter (Signed)
Patient is calling regarding bacterial infection. Patient stated that she completed the metronidazole last night. Patient stated that she is feeling better but feels "70%." Patient stated that she was feeling better yesterday, but woke up "feeling like it was starting again."

## 2019-04-15 NOTE — Progress Notes (Signed)
GYNECOLOGY  VISIT  CC:   Pressure  HPI: 58 y.o. G3P3 Divorced White or Caucasian female here for pelvic pressure.  She was seen about a week ago and BV was diagnosed.  Reports she was feeling better about two days ago but symptoms seem to be back.  She is having pelvic pressure again.  She denies vaginal discharge or vaginal bleeding.  She did have intercourse this weekend for the first time in several months.  Denies dysuria.  She is having a heaviness sensation.  She did use the metrogel twice daily for two days but then nightly for another five days of therapy.    GYNECOLOGIC HISTORY: Patient's last menstrual period was 04/28/2015. Contraception: PMP Menopausal hormone therapy: none  Patient Active Problem List   Diagnosis Date Noted  . Numbness around mouth 09/23/2018  . DDD (degenerative disc disease), cervical 08/23/2018  . Primary osteoarthritis of first carpometacarpal joint of one hand, right 08/23/2018  . CLL (chronic lymphocytic leukemia) ([place]) 09/02/2017  . Trigger middle finger of right hand 11/07/2015  . Insomnia 03/31/2014  . Pain around left eye   . Secondary open-angle glaucoma of left eye, severe stage 02/07/2013    Past Medical History:  Diagnosis Date  . Anxiety   . CLL (chronic lymphocytic leukemia) ([place])   . Cluster headaches   . Other specified glaucoma   . Pain around left eye     several bouts  sicne 2011.     Past Surgical History:  Procedure Laterality Date  . AUGMENTATION MAMMAPLASTY  2010   Mini-lift in 01/2019  . CERVICAL DISC SURGERY     2001    MEDS:   Current Outpatient Medications on File Prior to Visit  Medication Sig Dispense Refill  . Multiple Vitamin (MULTIVITAMIN) tablet Take 1 tablet by mouth daily.    . timolol (BETIMOL) 0.25 % ophthalmic solution 1-2 drops 2 (two) times daily.    . TRAVATAN Z 0.004 % SOLN ophthalmic solution PLACE 1 DROP INTO THE LEFT EYE NIGHTLY  5   No current facility-administered medications on file prior  to visit.     ALLERGIES: Patient has no known allergies.  Family History  Problem Relation Age of Onset  . Diabetes Father   . Stroke Mother   . Heart attack Mother   . Stomach cancer Maternal Aunt   . Down syndrome Sister   . Colon cancer Neg Hx     SH:  Divorced, non smoker  Review of Systems  Genitourinary:       Pressure   All other systems reviewed and are negative.   PHYSICAL EXAMINATION:    BP 138/82   Pulse 68   Temp 97.6 F (36.4 C) (Temporal)   Ht 5\' 6"  (1.676 m)   Wt 162 lb (73.5 kg)   LMP 04/28/2015   BMI 26.15 kg/m     General appearance: alert, cooperative and appears stated age Abdomen: soft, non-tender; bowel sounds normal; no masses,  no organomegaly Lymph:  no inguinal LAD noted  Pelvic: External genitalia:  no lesions              Urethra:  normal appearing urethra with no masses, tenderness or lesions              Bartholins and Skenes: normal                 Vagina: normal appearing vagina with normal color and discharge, no lesions  Cervix: no lesions              Bimanual Exam:  Uterus:  normal size, contour, position, consistency, mobility, non-tender              Adnexa: no mass, fullness, tenderness  Chaperone was present for exam.  Assessment: Recently treated BV Bladder pressure Recent intercourse  Plan: Urine culture and micro pending Bactrim DS BId x 3 days Repeat affirm pending She has one additional diflucan and I did advise she go ahead and finish this today.

## 2019-04-15 NOTE — Telephone Encounter (Signed)
Call reviewed with Dr. Sabra Heck, call returned to patient. OV scheduled for today at 12pm. Covid19 prescreen negative, precautions reviewed. Patient verbalizes understanding and is agreeable.   Encounter closed.

## 2019-04-16 LAB — URINALYSIS, MICROSCOPIC ONLY: Casts: NONE SEEN /lpf

## 2019-04-17 LAB — URINE CULTURE

## 2019-04-18 LAB — VAGINITIS/VAGINOSIS, DNA PROBE
Candida Species: NEGATIVE
Gardnerella vaginalis: NEGATIVE
Trichomonas vaginosis: NEGATIVE

## 2019-06-30 ENCOUNTER — Ambulatory Visit
Admission: RE | Admit: 2019-06-30 | Discharge: 2019-06-30 | Disposition: A | Discharge status: Home / Self Care | Payer: Commercial Managed Care - PPO | Source: Ambulatory Visit | Attending: Obstetrics & Gynecology | Admitting: Obstetrics & Gynecology

## 2019-06-30 ENCOUNTER — Other Ambulatory Visit: Payer: Self-pay

## 2019-06-30 DIAGNOSIS — Z1231 Encounter for screening mammogram for malignant neoplasm of breast: Secondary | ICD-10-CM

## 2019-08-04 IMAGING — MR MRI CERVICAL SPINE WITHOUT AND WITH CONTRAST
7 of 8 series · 38 of 48 positions shown · IV contrast (13ml multihance)
Comparison: Cervical spine radiographs 08/18/2018, CT myelogram
02/10/2011

CLINICAL DATA: Cervicalgia.  Cervical disc degeneration

EXAM:
MRI CERVICAL SPINE WITHOUT AND WITH CONTRAST
TECHNIQUE: Multiplanar and multiecho pulse sequences of the cervical spine, to
include the craniocervical junction and cervicothoracic junction,
were obtained without and with intravenous contrast.
CONTRAST:  13mL MULTIHANCE GADOBENATE DIMEGLUMINE 529 MG/ML IV SOLN

[Series 2: STIR · sagittal · 3.3mm · 0.82mm/px · 4 of 12 slices shown]
[im 1/12]
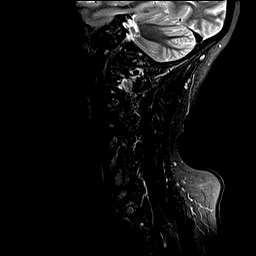
[im 4/12]
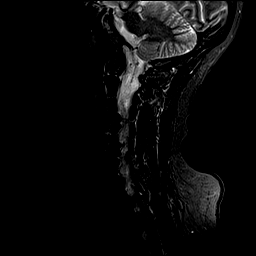
[im 8/12]
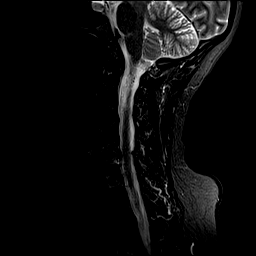
[im 12/12]
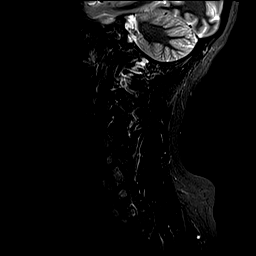

[Series 3: T1 · sagittal · 3.3mm · 0.41mm/px · 4 of 12 slices shown (1 of 3)]
[im 1/12]
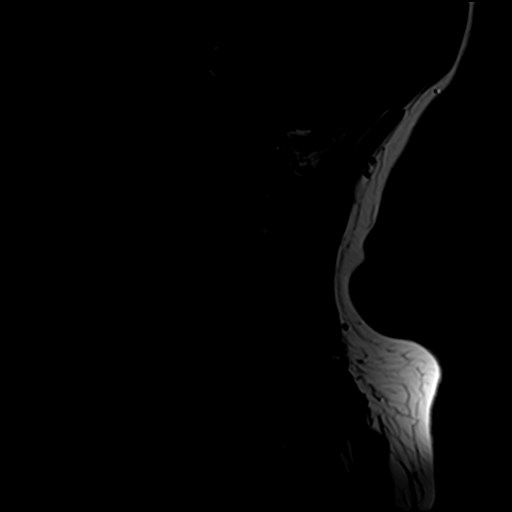
[im 4/12]
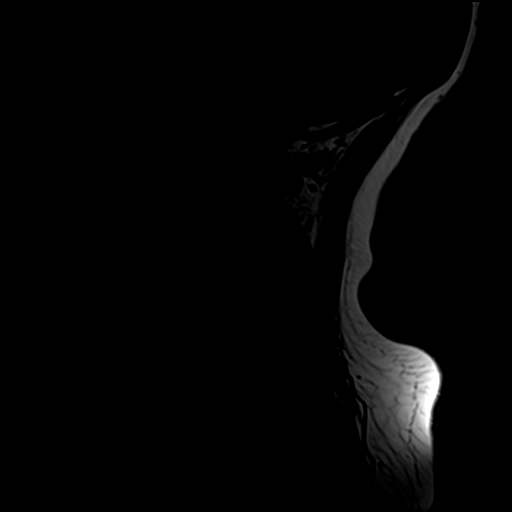
[im 8/12]
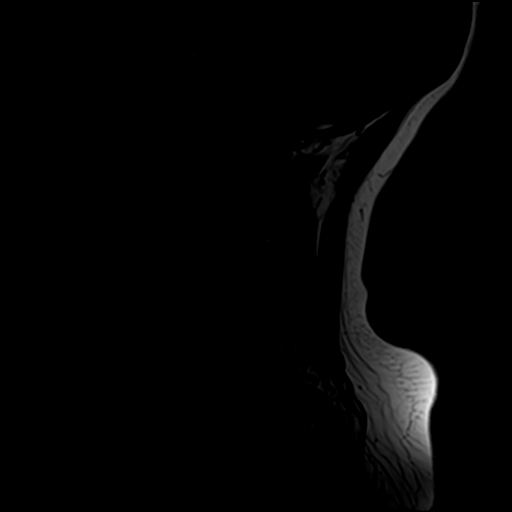
[im 12/12]
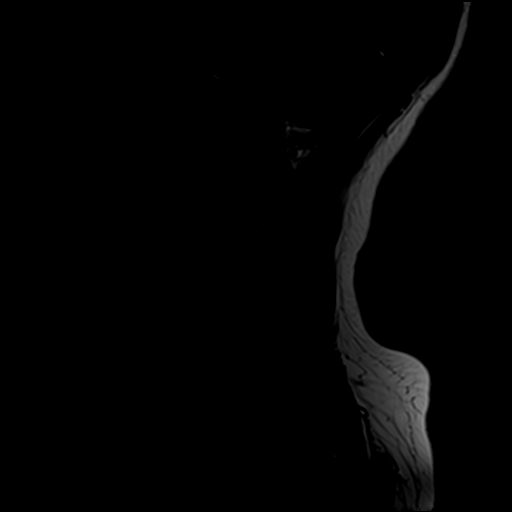

[Series 5: T2 · axial · 3.0mm · 0.70mm/px · z∈[-199,-119]mm · 8 of 24 slices shown (1 of 2)]
[im 1/24]
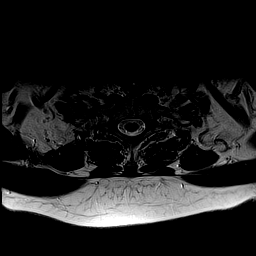
[im 4/24]
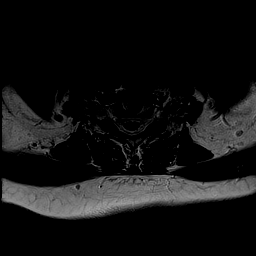
[im 7/24]
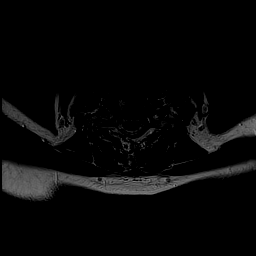
[im 10/24]
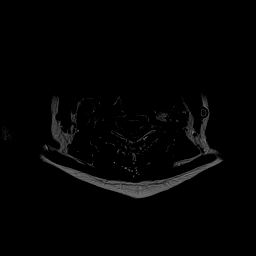
[im 14/24]
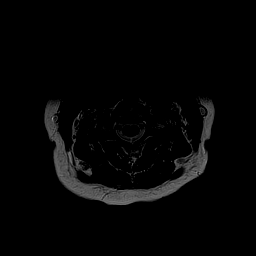
[im 17/24]
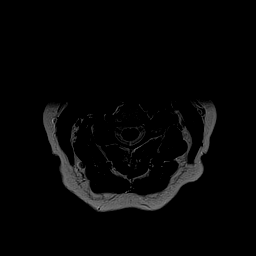
[im 20/24]
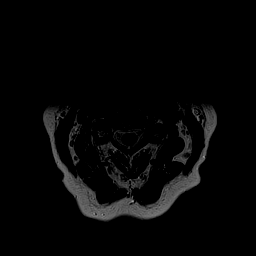
[im 24/24]
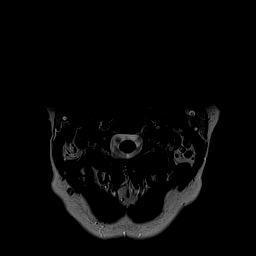

[Series 6: T1 · axial · 3.0mm · 0.70mm/px · z∈[-199,-119]mm · 8 of 24 slices shown (2 of 3)]
[im 1/24]
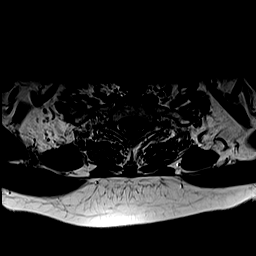
[im 4/24]
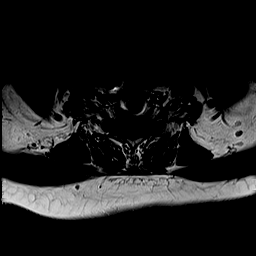
[im 7/24]
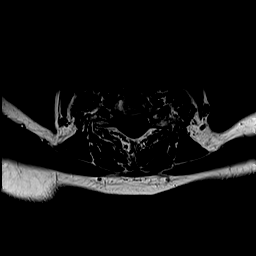
[im 10/24]
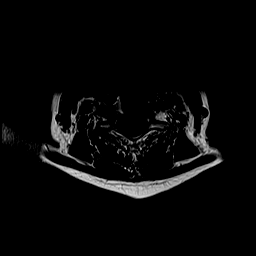
[im 14/24]
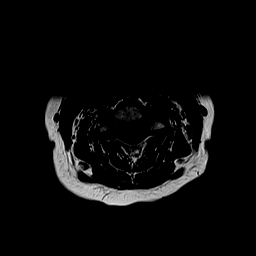
[im 17/24]
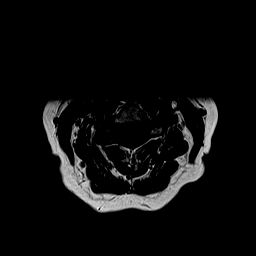
[im 20/24]
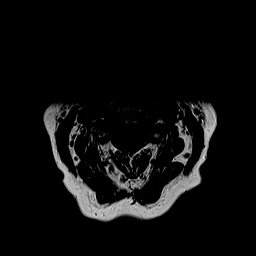
[im 24/24]
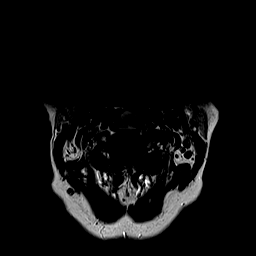

[Series 7: T2 · sagittal · 3.3mm · 0.41mm/px · 4 of 12 slices shown (2 of 2)]
[im 1/12]
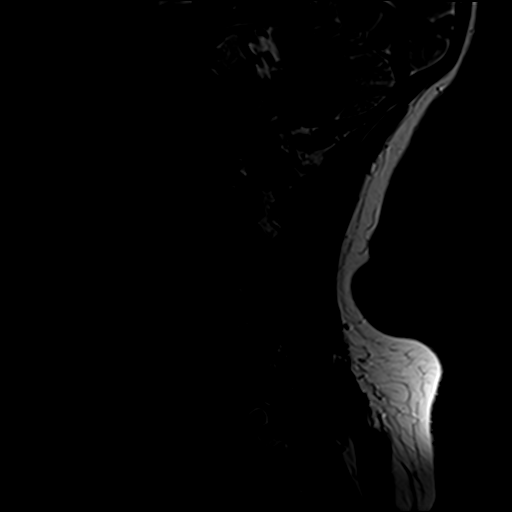
[im 4/12]
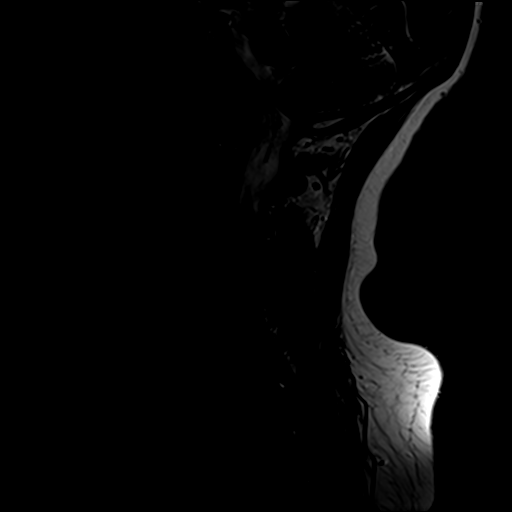
[im 8/12]
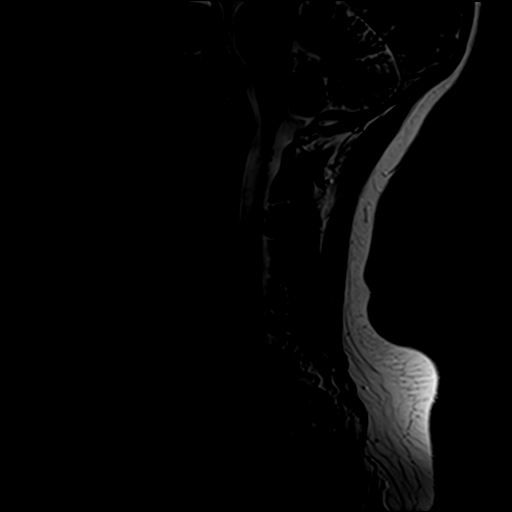
[im 12/12]
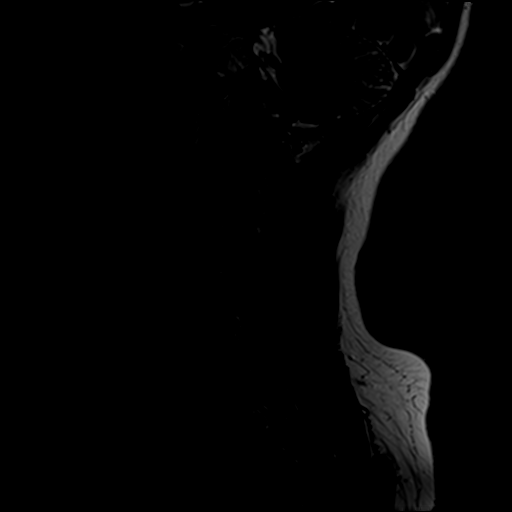

[Series 8: T1 post-contrast · sagittal · 3.3mm · 0.41mm/px · 2 of 12 slices shown]
[im 1/12]
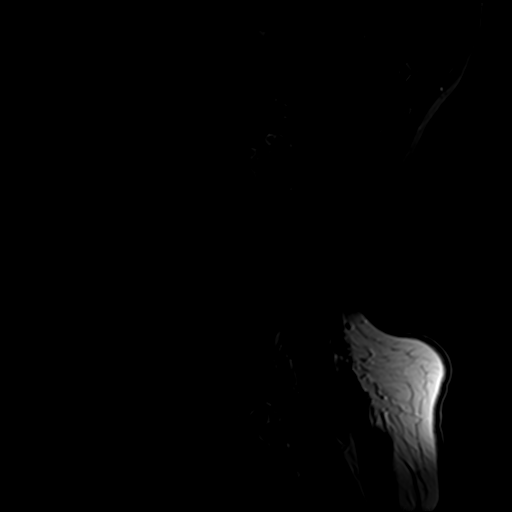
[im 4/12]
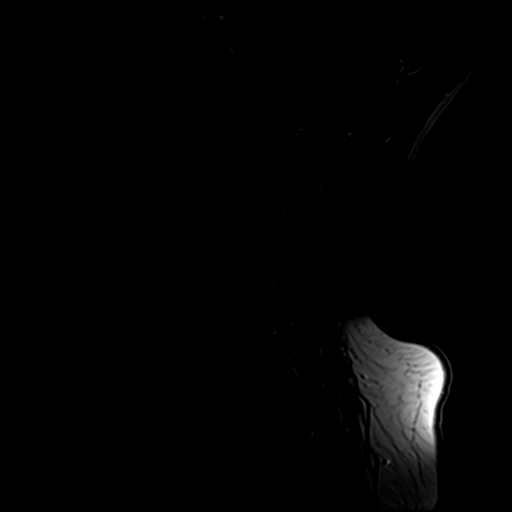

[Series 9: T1 · axial · 3.0mm · 0.70mm/px · z∈[-199,-119]mm · 8 of 24 slices shown (3 of 3)]
[im 1/24]
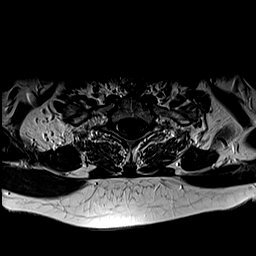
[im 4/24]
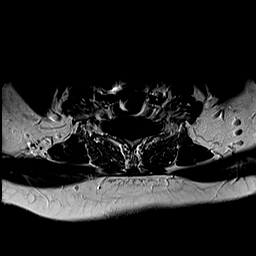
[im 7/24]
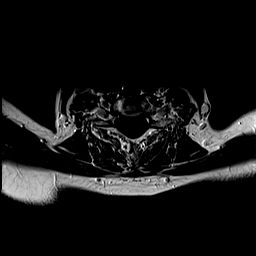
[im 10/24]
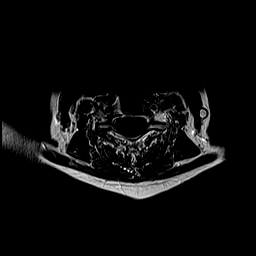
[im 14/24]
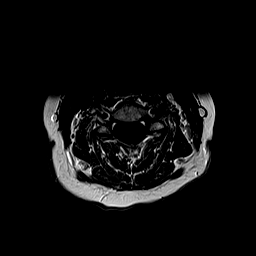
[im 17/24]
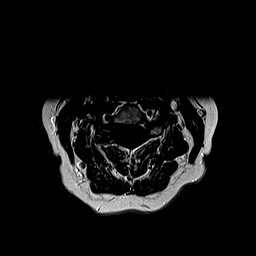
[im 20/24]
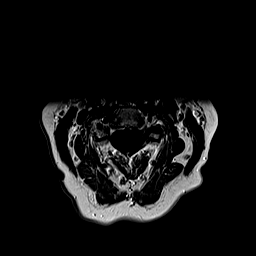
[im 24/24]
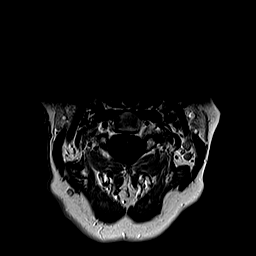

[38 of 48 positions shown; findings below may reference images not displayed]

FINDINGS: Alignment: 2 mm retrolisthesis C5-6. Read remaining alignment
normal.

Vertebrae: ACDF C6-7.  Negative for fracture or mass.

Cord: Normal signal and morphology.  No cord compression.

Posterior Fossa, vertebral arteries, paraspinal tissues: Negative

Disc levels:

C2-3: Tiny central disc protrusion

C3-4: Moderate right foraminal encroachment due to uncinate spurring
and facet degeneration on the right. This is new since 8408.

C4-5: Bilateral facet degeneration. Mild foraminal narrowing
bilaterally

C5-6: Moderate disc degeneration with diffuse uncinate spurring.
Moderate foraminal encroachment bilaterally similar to 8408

C6-7: ACDF.  Negative for stenosis

C7-T1: Negative
IMPRESSION: Moderate right foraminal encroachment C3-4

Moderate foraminal encroachment bilaterally at C5-6 due to spurring,
similar to 8408

ACDF C6-7.  Negative for stenosis.

## 2019-08-23 ENCOUNTER — Other Ambulatory Visit: Payer: Self-pay

## 2019-08-25 ENCOUNTER — Ambulatory Visit: Payer: Commercial Managed Care - PPO | Admitting: Obstetrics & Gynecology

## 2019-09-08 ENCOUNTER — Encounter: Payer: Self-pay | Admitting: Obstetrics & Gynecology

## 2019-09-12 ENCOUNTER — Other Ambulatory Visit: Payer: Self-pay

## 2019-09-13 ENCOUNTER — Encounter: Payer: Self-pay | Admitting: Obstetrics & Gynecology

## 2019-09-13 ENCOUNTER — Other Ambulatory Visit (HOSPITAL_COMMUNITY)
Admission: RE | Admit: 2019-09-13 | Discharge: 2019-09-13 | Disposition: A | Discharge status: Home / Self Care | Payer: Commercial Managed Care - PPO | Source: Ambulatory Visit | Attending: Obstetrics & Gynecology | Admitting: Obstetrics & Gynecology

## 2019-09-13 ENCOUNTER — Ambulatory Visit (INDEPENDENT_AMBULATORY_CARE_PROVIDER_SITE_OTHER): Payer: Commercial Managed Care - PPO | Admitting: Obstetrics & Gynecology

## 2019-09-13 VITALS — BP 110/60 | HR 68 | Temp 97.0°F | Resp 10 | Ht 65.5 in | Wt 163.0 lb

## 2019-09-13 DIAGNOSIS — Z124 Encounter for screening for malignant neoplasm of cervix: Secondary | ICD-10-CM | POA: Diagnosis not present

## 2019-09-13 DIAGNOSIS — F320 Major depressive disorder, single episode, mild: Secondary | ICD-10-CM

## 2019-09-13 DIAGNOSIS — Z01419 Encounter for gynecological examination (general) (routine) without abnormal findings: Secondary | ICD-10-CM | POA: Diagnosis not present

## 2019-09-13 DIAGNOSIS — F419 Anxiety disorder, unspecified: Secondary | ICD-10-CM | POA: Diagnosis not present

## 2019-09-13 DIAGNOSIS — C9110 Chronic lymphocytic leukemia of B-cell type not having achieved remission: Secondary | ICD-10-CM | POA: Diagnosis not present

## 2019-09-13 MED ORDER — VALACYCLOVIR HCL 500 MG PO TABS: 500.0000 mg | ORAL_TABLET | Freq: Two times a day (BID) | ORAL | 4 refills | Status: DC | PRN

## 2019-09-13 MED ORDER — ALPRAZOLAM 0.5 MG PO TABS: ORAL_TABLET | ORAL | 0 refills | Status: DC

## 2019-09-13 MED ORDER — SERTRALINE HCL 50 MG PO TABS: 50.0000 mg | ORAL_TABLET | Freq: Every day | ORAL | 1 refills | Status: DC

## 2019-09-13 MED ORDER — ACYCLOVIR 5 % EX CREA: 1.0000 "application " | TOPICAL_CREAM | CUTANEOUS | 2 refills | Status: DC

## 2019-09-13 NOTE — Patient Instructions (Signed)
Danielle Kim [address] [place], [place] [address] [phone]

## 2019-09-13 NOTE — Progress Notes (Signed)
59 y.o. G3P3 Divorced White or Caucasian female here for annual exam.  Planning trip to [place], [place], and she is going to spend a week.  Going to see a friend.  Has CLL and has not had follow up with heme/onc since initial consultation.  Needs CBC drawn today.  Pt wants to know if ok to go to [place].    Having some decreased mood.  Feels she is having some anxiety as well.  Of course she is struggling with her son's death.  He has a life Set designer where she was the beneficiary.  Feels like this is "blood [place]".  She's been able to help her two other children with this by purchasing cars.  She purchased good, used ones.  She is being wise with the [place].  Of course would rather have her son back.  She is not telling family how much [place] she received.  Ex-husband asked almost immediately about who was the beneficiary of life insurance policy.  This really angered her other children.    Pt has rental properties.  Has felt the stressors of having these properties is just more than she can handle.  She has sold one and is planning on trying to sell most of the other.    Denies vaginal bleeding.    Daughter had kidney stone and ended up with sepsis.  Pt tried to get her not to go to the emergency room but daughter eventually did go because she felt so terrible.  Once there, provider told her she was very sick and could have died had she not gone to the ER.  Pt feels some guilt for this as well but she didn't want her daughter to go to the ER and get Covid.  This was when the numbers were so bad.    She does have a lot going on right now especially considering son's death.  She likely will benefit from treatment.  Discussed depression and anxiety inventories for objective comparison in the future.  Results are as follows.    PHQ9 today: 10/27 Anxiety inventory: 11/21  Patient's last menstrual period was 04/28/2015.          Sexually active: No.  The current method of family planning is post  menopausal status.    Exercising: No.  The patient does not participate in regular exercise at present. Smoker:  no  Health Maintenance: Pap:  03/16/17 Neg. HR HPV:neg             03/31/14 Neg History of abnormal Pap:  yes MMG:  06/30/19 BIRADS 1 negative/density b Colonoscopy:  09/14/13 Normal. F/u 10 years BMD:   never TDaP:  03/31/19  Pneumonia vaccine(s):  n/a Shingrix:   completed Hep C testing: 03/16/17 Neg  Screening Labs: discuss today   reports that she has quit smoking. Her smoking use included cigarettes. She has never used smokeless tobacco. She reports current alcohol use of about 2.0 standard drinks of alcohol per week. She reports that she does not use drugs.  Past Medical History:  Diagnosis Date  . Anxiety   . CLL (chronic lymphocytic leukemia) ([place])   . Cluster headaches   . Other specified glaucoma   . Pain around left eye     several bouts  sicne 2011.     Past Surgical History:  Procedure Laterality Date  . [place] SURGERY     2001  . REDUCTION MAMMAPLASTY      Current Outpatient Medications  Medication Sig  Dispense Refill  . Multiple Vitamin (MULTIVITAMIN) tablet Take 1 tablet by mouth daily.    . timolol (BETIMOL) 0.25 % ophthalmic solution 1-2 drops 2 (two) times daily.    . TRAVATAN Z 0.004 % SOLN ophthalmic solution PLACE 1 DROP INTO THE LEFT EYE NIGHTLY  5  . valACYclovir (VALTREX) 500 MG tablet Take 500 mg by mouth 2 (two) times daily as needed.    . hydrOXYzine (ATARAX/VISTARIL) 50 MG tablet Take 50 mg by mouth at bedtime.     No current facility-administered medications for this visit.    Family History  Problem Relation Age of Onset  . Diabetes Father   . Stroke Mother   . Heart attack Mother   . Stomach cancer Maternal Aunt   . Down syndrome Sister   . Colon cancer Neg Hx     Review of Systems  All other systems reviewed and are negative.   Exam:   BP 110/60 (BP Location: Right Arm, Patient Position: Sitting, Cuff Size:  Normal)   Pulse 68   Temp (!) 97 F (36.1 C) (Temporal)   Resp 10   Ht 5' 5.5" (1.664 m)   Wt 163 lb (73.9 kg)   LMP 04/28/2015   BMI 26.71 kg/m   Height: 5' 5.5" (166.4 cm)  Ht Readings from Last 3 Encounters:  09/13/19 5' 5.5" (1.664 m)  04/15/19 5\' 6"  (1.676 m)  04/07/19 5\' 6"  (1.676 m)    General appearance: alert, cooperative and appears stated age Head: Normocephalic, without obvious abnormality, atraumatic Neck: no adenopathy, supple, symmetrical, trachea midline and thyroid normal to inspection and palpation Lungs: clear to auscultation bilaterally Breasts: normal appearance, no masses or tenderness Heart: regular rate and rhythm Abdomen: soft, non-tender; bowel sounds normal; no masses,  no organomegaly Extremities: extremities normal, atraumatic, no cyanosis or edema Skin: Skin color, texture, turgor normal. No rashes or lesions Lymph nodes: Cervical, supraclavicular, and axillary nodes normal. No abnormal inguinal nodes palpated Neurologic: Grossly normal   Pelvic: External genitalia:  no lesions              Urethra:  normal appearing urethra with no masses, tenderness or lesions              Bartholins and Skenes: normal                 Vagina: normal appearing vagina with normal color and discharge, no lesions              Cervix: no lesions              Pap taken: Yes.   Bimanual Exam:  Uterus:  normal size, contour, position, consistency, mobility, non-tender              Adnexa: normal adnexa and no mass, fullness, tenderness               Rectovaginal: Confirms               Anus:  normal sphincter tone, no lesions  Chaperone, Terence Lux, CMA, was present for exam.  A:  Well Woman with normal exam Grief reaction after loss of son Anxiety and Depression CLL, has not done recent follow up with heme/onc Open angle glaucoma of left eye  P:   Mammogram guidelines reviewed pap smear and HR HPV obtained today Will start paxil 50mg  daily for 4 weeks.   Rx to pharmacy.  She is going to give update in two weeks to ensure  this is helping.   CBC and CMP obtained today Rx for Xanax 0.5mg  1 po qd prn anxiety.  #20/0RF.  She is going to use these sparingly and only when needed. RF for Valtrex 500mg  daily.  #90/4RF.  She is going to take this daily for 3-6 months due to recent outbreaks. Rx for acyclovir ointment to use up to TID as needed return annually or prn  In addition to gynecology concerns and exam, additional 30 minutes spend with pt discussing recent grief, depression, anxiety and treatment options.

## 2019-09-14 ENCOUNTER — Telehealth: Payer: Self-pay | Admitting: *Deleted

## 2019-09-14 LAB — CBC WITH DIFFERENTIAL/PLATELET
Basophils Absolute: 0.1 10*3/uL (ref 0.0–0.2)
Basos: 0 %
EOS (ABSOLUTE): 0.1 10*3/uL (ref 0.0–0.4)
Eos: 0 %
Hematocrit: 42.5 % (ref 34.0–46.6)
Hemoglobin: 14.5 g/dL (ref 11.1–15.9)
Immature Grans (Abs): 0 10*3/uL (ref 0.0–0.1)
Immature Granulocytes: 0 %
Lymphocytes Absolute: 21.2 10*3/uL — ABNORMAL HIGH (ref 0.7–3.1)
Lymphs: 84 %
MCH: 30.7 pg (ref 26.6–33.0)
MCHC: 34.1 g/dL (ref 31.5–35.7)
MCV: 90 fL (ref 79–97)
Monocytes Absolute: 0.5 10*3/uL (ref 0.1–0.9)
Monocytes: 2 %
Neutrophils Absolute: 3.5 10*3/uL (ref 1.4–7.0)
Neutrophils: 14 %
Platelets: 260 10*3/uL (ref 150–450)
RBC: 4.73 x10E6/uL (ref 3.77–5.28)
RDW: 14 % (ref 11.7–15.4)
WBC: 25.4 10*3/uL (ref 3.4–10.8)

## 2019-09-14 LAB — COMPREHENSIVE METABOLIC PANEL
ALT: 32 IU/L (ref 0–32)
AST: 29 IU/L (ref 0–40)
Albumin/Globulin Ratio: 1.7 (ref 1.2–2.2)
Albumin: 4.7 g/dL (ref 3.8–4.9)
Alkaline Phosphatase: 111 IU/L (ref 39–117)
BUN/Creatinine Ratio: 13 (ref 9–23)
BUN: 12 mg/dL (ref 6–24)
Bilirubin Total: 0.3 mg/dL (ref 0.0–1.2)
CO2: 23 mmol/L (ref 20–29)
Calcium: 9.5 mg/dL (ref 8.7–10.2)
Chloride: 103 mmol/L (ref 96–106)
Creatinine, Ser: 0.91 mg/dL (ref 0.57–1.00)
GFR calc Af Amer: 80 mL/min/{1.73_m2} (ref >59)
GFR calc non Af Amer: 70 mL/min/{1.73_m2} (ref >59)
Globulin, Total: 2.7 g/dL (ref 1.5–4.5)
Glucose: 104 mg/dL — ABNORMAL HIGH (ref 65–99)
Potassium: 4.2 mmol/L (ref 3.5–5.2)
Sodium: 144 mmol/L (ref 134–144)
Total Protein: 7.4 g/dL (ref 6.0–8.5)

## 2019-09-14 LAB — CYTOLOGY - PAP
Comment: NEGATIVE
Diagnosis: NEGATIVE
High risk HPV: NEGATIVE

## 2019-09-14 NOTE — Telephone Encounter (Signed)
09/13/19 CBC w/diff  WBC 25.4 Lymphs 21.2  Patient with Hx of CLL  Routing to Dr. Sabra Heck to review results and advise.

## 2019-09-14 NOTE — Telephone Encounter (Signed)
Patient has known CLL.  She saw oncology back in 2019.  She did not follow-up.  I sent a message when I saw this result earlier to [facility] who works with Dr. Marin Olp.  They feels fine for her to go on her upcoming trip to [place].  Please let her know this.  However she does need to follow-up with them.  She is still an established patient so she can just call make the appointment.  Please see if she has any additional questions.  Thanks

## 2019-09-14 NOTE — Telephone Encounter (Signed)
Spoke to pt. Pt given results of labs and recommendations per Dr Sabra Heck and to follow up with Hem/Onc. Pt agreeable and verbalized understanding to call for appt for follow up on labs.   Pt wanted to know if Dr Sabra Heck mentioned about getting Pneumonia vaccine after speaking to Dr Antonieta Pert office?   Routing to Dr Sabra Heck.

## 2019-09-19 NOTE — Telephone Encounter (Signed)
Received Rx from Dr Sabra Heck. Rx for Prevnar and [facility]neumovax [facility] on [facility].   Encounter closed.

## 2019-09-19 NOTE — Telephone Encounter (Signed)
Yes, she should proceed with the pneumonia vaccination series.  She will need to do Prevnar 13 and then 8 weeks later or more, have the pneumovax.  Then the pneumovax is to be repeated every 5-10 years.  I can write prescriptions for these and we can send to her to have done at a local pharmacy or [place].

## 2019-09-19 NOTE — Telephone Encounter (Signed)
Spoke to pt. Pt given recommendations. Pt agreeable to Prevnar 13 and Pneumovax series. Pt would [facility] [place] to receive there.  Routing to Dr Sabra Heck for review and Rx to be given and faxed.

## 2019-09-26 ENCOUNTER — Ambulatory Visit: Payer: Commercial Managed Care - PPO | Admitting: Obstetrics & Gynecology

## 2019-10-05 ENCOUNTER — Other Ambulatory Visit: Payer: Self-pay | Admitting: Obstetrics & Gynecology

## 2019-10-19 ENCOUNTER — Encounter: Payer: Self-pay | Admitting: Certified Nurse Midwife

## 2019-10-31 ENCOUNTER — Telehealth: Payer: Self-pay | Admitting: Obstetrics & Gynecology

## 2019-10-31 NOTE — Telephone Encounter (Signed)
Patient is asking to talk with Dr.Miller's nurse about changing to Wellbutrin. Marland Kitchen

## 2019-10-31 NOTE — Telephone Encounter (Signed)
Patient want to discuss a medication change.

## 2019-11-01 NOTE — Telephone Encounter (Signed)
Left message to call Sharee Pimple, RN at [place].   Last AEX 09/13/19

## 2019-11-01 NOTE — Telephone Encounter (Signed)
Patient is returning call to Jill. °

## 2019-11-02 NOTE — Telephone Encounter (Signed)
I don't usually do FMLA papers for depression/anxiety but I did the depression inventory with her when she was here in February and started her on Paxil so I think it is appropriate for me to do these.

## 2019-11-02 NOTE — Telephone Encounter (Signed)
Spoke with patient. Patient has additional question for Dr. Sabra Heck.  Patient reports anxiety and depression since the loss of her son in 2019. Patient also sees a therapist, therapist recommended patient take time off to settle sons estate in [place] and grieve. Patient is asking if Dr. Sabra Heck can complete intermittent FMLA papers for her? If not, would PCP be appropriate?  Advised patient I will have to review with Dr. Sabra Heck and return call, patient appreciative.   Dr. Sabra Heck -please advise on FMLA and medication change request.

## 2019-11-02 NOTE — Telephone Encounter (Signed)
Dr. Sabra Heck -please review request for change from Zoloft to Wellbutrin and advise.

## 2019-11-02 NOTE — Telephone Encounter (Signed)
Patient called to speak with Sharee Pimple. Patient wants to add more questions to the previous conversation she had with Sharee Pimple.

## 2019-11-02 NOTE — Telephone Encounter (Signed)
Spoke with patient. Patient currently taking Zoloft 25 mg (1/2 of 50 mg tab) PO daily for depression. Reports medication is working "fine", but is concerned that she has gained 5 lbs since starting medication. Patient is requesting to change to Wellbutrin. Patient states she has taken Wellbutrin in the past and did not experience weight gain.   Patient was seen in office on 09/13/19, was advised to call to provide update.   Advised patient I will have to review Rx request with Dr. Sabra Heck and f/u with recommendations. Confirmed pharmacy on file. Patient agreeable. Request detailed message upon return call if no answer.   Dr. Sabra Heck -please review and advise on med change request.

## 2019-11-04 MED ORDER — BUPROPION HCL ER (XL) 150 MG PO TB24: 150.0000 mg | ORAL_TABLET | Freq: Every day | ORAL | 0 refills | Status: DC

## 2019-11-04 NOTE — Telephone Encounter (Signed)
I chose Zoloft because it works for both anxiety and depression.  Wellbutrin only works for depression.  It's ok to switch as long as she knows it is not going to help her anxiety.  If she desires to change, she can just switch directly to Wellbutrin 150mg  XL daily.  Should plan recheck 3 weeks (web based fine) if switches.

## 2019-11-04 NOTE — Telephone Encounter (Signed)
Spoke with patient, advised of all recommendations per Dr. Sabra Heck.   1. Patient verbalizes understanding and request to switch to Wellbutrin. Rx to verified pharmacy. OV scheduled for 5/4 at 4pm.   2. Patient is going to discuss again FMLA with her employer, will notify office if she decides to proceed. Reviewed process/fee with patient. Patient is aware to call if any questions.   Routing to provider for final review. Patient is agreeable to disposition. Will close encounter.   Cc: Reesa Chew, RN

## 2019-11-10 ENCOUNTER — Telehealth: Payer: Self-pay | Admitting: Obstetrics & Gynecology

## 2019-11-10 DIAGNOSIS — Z0289 Encounter for other administrative examinations: Secondary | ICD-10-CM

## 2019-11-10 NOTE — Telephone Encounter (Signed)
Patient dropped of FMLA forms to be completed. FMLA forms sent to [facility].

## 2019-11-16 NOTE — Telephone Encounter (Signed)
FMLA forms to Dr.Miller.

## 2019-11-21 NOTE — Telephone Encounter (Signed)
Left message to call Elliot Meldrum at [phone]. 

## 2019-11-21 NOTE — Telephone Encounter (Signed)
Spoke with patient. Advised FMLA forms are ready to pick up. Patient is agreeable. Patient gives authorization for daughter Alvie Heidelberg to pick up forms. FMLA forms to the front desk for pick up. Encounter closed.

## 2019-11-24 NOTE — Progress Notes (Signed)
GYNECOLOGY  VISIT  CC:   Follow up  HPI: 59 y.o. G3P3 Divorced White or Caucasian female here for medication follow up.  Taking wellbutrin XL 150mg  daily.  She is seeing a therapist who really think she got in relationship with her significant other.  Patient is seeing that he is not very empathetic and not good at communication when there issues or life is hard.  She also does not think he likes her daughter very much and this is an issue as well.  She is working on Licensed conveyancer some rental properties to try to make life less complicated.  She actually admits that she is taking her Wellbutrin XL by cutting it in half.  Advised her this is not how this medication be used because it is an extended life medication.  We discussed about switching to an SR and letting her decrease to once daily if she would like to see how she feels with this dosage.  She is amenable to this.  Overall she says she is feeling better that she is handling life better.  Course there are still days that are very hard.  She says she is crying less and this is a good thing.  PHQ 9 Score done again today: 8 Was 10 at the prior visit.  GYNECOLOGIC HISTORY: Patient's last menstrual period was 04/28/2015. Contraception: post menopausal Menopausal hormone therapy: none  Patient Active Problem List   Diagnosis Date Noted  . Numbness around mouth 09/23/2018  . DDD (degenerative disc disease), cervical 08/23/2018  . Primary osteoarthritis of first carpometacarpal joint of one hand, right 08/23/2018  . CLL (chronic lymphocytic leukemia) ([place]) 09/02/2017  . Trigger middle finger of right hand 11/07/2015  . Insomnia 03/31/2014  . Pain around left eye   . Secondary open-angle glaucoma of left eye, severe stage 02/07/2013    Past Medical History:  Diagnosis Date  . Anxiety   . CLL (chronic lymphocytic leukemia) ([place])   . Cluster headaches   . Depression   . Other specified glaucoma   . Pain around left eye     several bouts   sicne 2011.     Past Surgical History:  Procedure Laterality Date  . [place] SURGERY     2001  . REDUCTION MAMMAPLASTY      MEDS:   Current Outpatient Medications on File Prior to Visit  Medication Sig Dispense Refill  . acyclovir cream (ZOVIRAX) 5 % Apply 1 application topically every 3 (three) hours. May use for up to 7 days 15 g 2  . ALPRAZolam (XANAX) 0.5 MG tablet 1 tablet daily prn for anxiety 20 tablet 0  . buPROPion (WELLBUTRIN XL) 150 MG 24 hr tablet Take 1 tablet (150 mg total) by mouth daily. 30 tablet 0  . timolol (BETIMOL) 0.25 % ophthalmic solution 1-2 drops 2 (two) times daily.    . TRAVATAN Z 0.004 % SOLN ophthalmic solution PLACE 1 DROP INTO THE LEFT EYE NIGHTLY  5  . valACYclovir (VALTREX) 500 MG tablet Take 1 tablet (500 mg total) by mouth 2 (two) times daily as needed. 90 tablet 4  . Multiple Vitamin (MULTIVITAMIN) tablet Take 1 tablet by mouth daily.     No current facility-administered medications on file prior to visit.    ALLERGIES: Patient has no known allergies.  Family History  Problem Relation Age of Onset  . Diabetes Father   . Stroke Mother   . Heart attack Mother   . Stomach cancer Maternal Aunt   .  Down syndrome Sister   . Colon cancer Neg Hx     SH:  Divorced, non smoker  Review of Systems  Constitutional: Negative.   Psychiatric/Behavioral:       Anxiety, depression    PHYSICAL EXAMINATION:    BP 114/60   Pulse 70   Temp 97.9 F (36.6 C) (Skin)   Resp 16   Wt 165 lb (74.8 kg)   LMP 04/28/2015   BMI 27.04 kg/m     General appearance: alert, cooperative and appears stated age  Assessment: Appropriate grief reaction after suicide of son in January 2021 Depression that is improved with Wellbutrin, although she is not taking Wellbutrin correctly.  We addressed this today. Anxiety that is improved as well.  Plan: Wellbutrin 150 mg SR 1 p.o. twice daily.  Patient will take it as prescribed for the next 2 to 4 weeks.  She  may decrease down to once a day if she would like to.  Patient feels at some point she will want to be off.  Advised will go down to 100 mg dose and then taper off.  Patient will let me know when she is ready to do this.   About 30 minutes total spent with patient face-to-face discussion

## 2019-11-26 ENCOUNTER — Other Ambulatory Visit: Payer: Self-pay | Admitting: Obstetrics & Gynecology

## 2019-11-28 ENCOUNTER — Other Ambulatory Visit: Payer: Self-pay

## 2019-11-29 ENCOUNTER — Ambulatory Visit: Payer: Commercial Managed Care - PPO | Admitting: Obstetrics & Gynecology

## 2019-11-29 ENCOUNTER — Other Ambulatory Visit: Payer: Self-pay

## 2019-11-29 ENCOUNTER — Encounter: Payer: Self-pay | Admitting: Obstetrics & Gynecology

## 2019-11-29 VITALS — BP 114/60 | HR 70 | Temp 97.9°F | Resp 16 | Wt 165.0 lb

## 2019-11-29 DIAGNOSIS — F32A Depression, unspecified: Secondary | ICD-10-CM

## 2019-11-29 DIAGNOSIS — F4321 Adjustment disorder with depressed mood: Secondary | ICD-10-CM

## 2019-11-29 DIAGNOSIS — F320 Major depressive disorder, single episode, mild: Secondary | ICD-10-CM | POA: Diagnosis not present

## 2019-11-29 DIAGNOSIS — F4320 Adjustment disorder, unspecified: Secondary | ICD-10-CM

## 2019-11-29 DIAGNOSIS — F419 Anxiety disorder, unspecified: Secondary | ICD-10-CM

## 2019-11-29 MED ORDER — BUPROPION HCL ER (SR) 150 MG PO TB12: 150.0000 mg | ORAL_TABLET | Freq: Two times a day (BID) | ORAL | 0 refills | Status: DC

## 2019-11-30 ENCOUNTER — Telehealth: Payer: Self-pay | Admitting: *Deleted

## 2019-11-30 DIAGNOSIS — F32A Depression, unspecified: Secondary | ICD-10-CM

## 2019-11-30 DIAGNOSIS — F419 Anxiety disorder, unspecified: Secondary | ICD-10-CM

## 2019-11-30 DIAGNOSIS — F320 Major depressive disorder, single episode, mild: Secondary | ICD-10-CM

## 2019-11-30 DIAGNOSIS — Z7689 Persons encountering health services in other specified circumstances: Secondary | ICD-10-CM

## 2019-11-30 NOTE — Telephone Encounter (Signed)
Patient states she discussed a nutritionist during her [place] on 11/29/19. HX of anxiety, depression and recent weight gain. Patient states she call to schedule and a referral is required.   Ambulatory referral placed to Sandie Ano, LDN at [facility] [address], [place], [place] [address] [phone]  Advised patient our referral coordinator will contact her with appt details once scheduled. Patient verbalizes understanding and is agreeable.   Routing to provider for final review. Patient is agreeable to disposition. Will close encounter.  Cc: Magdalene Patricia

## 2019-11-30 NOTE — Telephone Encounter (Signed)
Patient says she will need a referral to the nutritionist discussed with Dr Sabra Heck.

## 2019-12-06 ENCOUNTER — Telehealth: Payer: Self-pay | Admitting: Obstetrics & Gynecology

## 2019-12-06 NOTE — Telephone Encounter (Signed)
Melissa with [facility] [place] called stating they do not treat the diagnosis code give on this referral (Z76.89 Encounter for weight management)

## 2019-12-07 NOTE — Telephone Encounter (Signed)
Reviewed with Dr. Sabra Heck. Call returned to [facility], Left message to call Sharee Pimple, RN at [facility].

## 2019-12-09 NOTE — Telephone Encounter (Signed)
Reviewed with Dr. Sabra Heck, call returned to [facility].   Patient scheduled with Sandie Ano on 12/20/19 at 0815, patient has been notified. No additional order needed.   Routing to Dr. Lestine Box.   CC: Magdalene Patricia  Encounter closed.

## 2019-12-20 ENCOUNTER — Encounter: Payer: Commercial Managed Care - PPO | Attending: Obstetrics & Gynecology | Admitting: Skilled Nursing Facility1

## 2019-12-20 ENCOUNTER — Other Ambulatory Visit: Payer: Self-pay

## 2019-12-20 DIAGNOSIS — E663 Overweight: Secondary | ICD-10-CM | POA: Insufficient documentation

## 2019-12-20 NOTE — Progress Notes (Addendum)
  Assessment:  Primary concerns today: weight management.   Pt states her son was found dead, she was diagnosed with leukemia, and her dog died in a short period of time so last year has not "given a shit".  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  Pt states she used to be very active and play tennis but since foot injury's cannot which is another stressor for her. Pt states she has recently taken up walking again.  Pt states she is typically 145 pounds.   Body Composition Scale 12/20/2019  Current Body Weight 164.1  Total Body Fat % 34.2  Visceral Fat 8  Fat-Free Mass % 65.7   Total Body Water % 47.3  Muscle-Mass lbs 29.8  BMI 26.3  Body Fat Displacement          Torso  lbs 34.7         Left Leg  lbs 6.9         Right Leg  lbs 6.9         Left Arm  lbs 3.4         Right Arm   lbs 3.4    MEDICATIONS: see list   DIETARY INTAKE:  Usual eating pattern includes 3 meals and 3+ snacks per day.  Everyday foods include none stated.  Avoided foods include none stated.    24-hr recall:  B ( AM): 2 eggs + 1 egg white half avocado + salsa or tomato + 1 piece whole wheat toast or museli + coconut milk Snk ( AM): nuts L ( PM): vegetables + olives + tuna with oil and vinegar Snk ( PM):  D ( PM): turnip greens + shrimp Snk ( PM):  Beverages: coffee + 1 tsp sugar + half and half  Usual physical activity: walking 4 days a week 30 minutes   Estimated energy needs: 1600 calories 180 g carbohydrates 120 g protein 44 g fat  Progress Towards Goal(s):  In progress.    Intervention:  Nutrition counseling. Dietitian educated pt on balanced meals within the context of emotional eating and weight management.  Goals: Aim to create balanced meals for breakfast lunch and dinner; complex carb, lean protein, non starchy vegetable Identify your need/feeling when about to emotionally eat using should I eat flowsheet; work on this with your therapist Try to focus on relationship with food rather  than weight  Take at least 10 minutes to eat each meal; take bites the size of thumb nail and chew until applesauce consistency  Identify satisfaction verses full  Teaching Method Utilized:  Visual Auditory Hands on  Handouts given during visit include:  Detailed MyPlate  Should I eat flowsheet  Barriers to learning/adherence to lifestyle change: emotional eating  Demonstrated degree of understanding via:  Teach Back   Monitoring/Evaluation:  Dietary intake, exercise, and body weight prn.

## 2019-12-21 ENCOUNTER — Telehealth: Payer: Self-pay | Admitting: Skilled Nursing Facility1

## 2019-12-21 ENCOUNTER — Other Ambulatory Visit: Payer: Self-pay | Admitting: Obstetrics & Gynecology

## 2019-12-21 NOTE — Telephone Encounter (Signed)
Pt called for her print out for bodycomp.  This was sent via email.

## 2019-12-21 NOTE — Telephone Encounter (Signed)
Medication refill request: Wellbutrin  Last AEX:  09-13-19 SM  Next AEX: 12-13-20 Last MMG (if hormonal medication request): n/a Refill authorized: Today, please advise.   Pharmacy requesting 90 day supply. Please advise.

## 2020-01-18 ENCOUNTER — Ambulatory Visit: Payer: Commercial Managed Care - PPO | Admitting: Skilled Nursing Facility1

## 2020-04-06 ENCOUNTER — Telehealth: Payer: Self-pay | Admitting: Osteopathic Medicine

## 2020-04-06 DIAGNOSIS — Z0000 Encounter for general adult medical examination without abnormal findings: Secondary | ICD-10-CM

## 2020-04-06 DIAGNOSIS — E7800 Pure hypercholesterolemia, unspecified: Secondary | ICD-10-CM

## 2020-04-06 NOTE — Telephone Encounter (Signed)
Orders signed.

## 2020-04-06 NOTE — Telephone Encounter (Signed)
Patient wanted to get labs done before appointment next week. Was also concerned about white blood cells due to her cancer.

## 2020-04-06 NOTE — Telephone Encounter (Signed)
Thank you :)

## 2020-04-06 NOTE — Telephone Encounter (Signed)
CBC, CMP, and Lipid panel pended.   Please advise if you would like anything additional added

## 2020-04-11 ENCOUNTER — Encounter: Payer: Commercial Managed Care - PPO | Admitting: Medical-Surgical

## 2020-05-09 ENCOUNTER — Telehealth (INDEPENDENT_AMBULATORY_CARE_PROVIDER_SITE_OTHER): Payer: Commercial Managed Care - PPO | Admitting: Osteopathic Medicine

## 2020-05-09 DIAGNOSIS — J040 Acute laryngitis: Secondary | ICD-10-CM

## 2020-05-09 DIAGNOSIS — J329 Chronic sinusitis, unspecified: Secondary | ICD-10-CM

## 2020-05-09 MED ORDER — GUAIFENESIN-CODEINE 100-10 MG/5ML PO SYRP: 5.0000 mL | ORAL_SOLUTION | Freq: Four times a day (QID) | ORAL | 0 refills | Status: DC | PRN

## 2020-05-09 MED ORDER — DOXYCYCLINE HYCLATE 100 MG PO TABS: 100.0000 mg | ORAL_TABLET | Freq: Two times a day (BID) | ORAL | 0 refills | Status: DC

## 2020-05-09 NOTE — Progress Notes (Signed)
Virtual Visit via Video (App used: MyChart) Note  I connected with      Danielle Kim on 05/09/20 at 1:32 PM  by a telemedicine application and verified that I am speaking with the correct person using two identifiers.  Patient is at home I am in office   I discussed the limitations of evaluation and management by telemedicine and the availability of in person appointments. The patient expressed understanding and agreed to proceed.  History of Present Illness: Danielle Kim is a 59 y.o. female who would like to discuss feeling ill, cough/sore throat, hoarse   Treatment a few weeks ago for sinus infection Reports some sinusitis was demonstrated on a scan done by her dentist, records are not available for my review at this time  5-6 days ago feeling some dry cough  Cough and headache  OTC cold medications, also given Flonase and tessalon via e-visit, taking Mucinex as well  No fever  Has had COVID vaccine     Observations/Objective: LMP 04/28/2015  BP Readings from Last 3 Encounters:  11/29/19 114/60  09/13/19 110/60  04/15/19 138/82   Exam: Speech is hoarse but she is speaking in complete sentences, no apparent distress  Lab and Radiology Results No results found for this or any previous visit (from the past 72 hour(s)). No results found.     Assessment and Plan: 58 y.o. female with The primary encounter diagnosis was Laryngitis. A diagnosis of Sinusitis, unspecified chronicity, unspecified location was also pertinent to this visit.  --> Trial escalation antibiotics, sent in cough syrup.  Covid testing is an option but I think as long as isolating for 10 days from onset of symptoms either way should be fine.  Patient is Covid vaccinated.  ER precautions were reviewed.  PDMP not reviewed this encounter. No orders of the defined types were placed in this encounter.  Meds ordered this encounter  Medications  . doxycycline (VIBRA-TABS) 100 MG tablet    Sig: Take  1 tablet (100 mg total) by mouth 2 (two) times daily.    Dispense:  14 tablet    Refill:  0  . DISCONTD: guaiFENesin-codeine (ROBITUSSIN AC) 100-10 MG/5ML syrup    Sig: Take 5-10 mLs by mouth 4 (four) times daily as needed for cough.    Dispense:  180 mL    Refill:  0  . guaiFENesin-codeine (ROBITUSSIN AC) 100-10 MG/5ML syrup    Sig: Take 5-10 mLs by mouth 4 (four) times daily as needed for cough.    Dispense:  180 mL    Refill:  0      Follow Up Instructions: Return if symptoms worsen or fail to improve.    I discussed the assessment and treatment plan with the patient. The patient was provided an opportunity to ask questions and all were answered. The patient agreed with the plan and demonstrated an understanding of the instructions.   The patient was advised to call back or seek an in-person evaluation if any new concerns, if symptoms worsen or if the condition fails to improve as anticipated.  30 minutes of non-face-to-face time was provided during this encounter.      . . . . . . . . . . . . . Marland Kitchen                   Historical information moved to improve visibility of documentation.  Past Medical History:  Diagnosis Date  . Anxiety   . CLL (  chronic lymphocytic leukemia) ([place])   . Cluster headaches   . Depression   . Other specified glaucoma   . Pain around left eye     several bouts  sicne 2011.    Past Surgical History:  Procedure Laterality Date  . [place] SURGERY     2001  . REDUCTION MAMMAPLASTY     Social History   Tobacco Use  . Smoking status: Former Smoker    Types: Cigarettes  . Smokeless tobacco: Never Used  . Tobacco comment: quit 9 months ago  Substance Use Topics  . Alcohol use: Yes    Alcohol/week: 2.0 standard drinks    Types: 2 Standard drinks or equivalent per week   family history includes Diabetes in her father; Down syndrome in her sister; Heart attack in her mother; Stomach cancer in her maternal  aunt; Stroke in her mother.  Medications: Current Outpatient Medications  Medication Sig Dispense Refill  . acyclovir cream (ZOVIRAX) 5 % Apply 1 application topically every 3 (three) hours. May use for up to 7 days 15 g 2  . ALPRAZolam (XANAX) 0.5 MG tablet 1 tablet daily prn for anxiety 20 tablet 0  . buPROPion (WELLBUTRIN SR) 150 MG 12 hr tablet TAKE 1 TABLET BY MOUTH TWICE A DAY 180 tablet 1  . doxycycline (VIBRA-TABS) 100 MG tablet Take 1 tablet (100 mg total) by mouth 2 (two) times daily. 14 tablet 0  . guaiFENesin-codeine (ROBITUSSIN AC) 100-10 MG/5ML syrup Take 5-10 mLs by mouth 4 (four) times daily as needed for cough. 180 mL 0  . Multiple Vitamin (MULTIVITAMIN) tablet Take 1 tablet by mouth daily.    . timolol (BETIMOL) 0.25 % ophthalmic solution 1-2 drops 2 (two) times daily.    . TRAVATAN Z 0.004 % SOLN ophthalmic solution PLACE 1 DROP INTO THE LEFT EYE NIGHTLY  5  . valACYclovir (VALTREX) 500 MG tablet Take 1 tablet (500 mg total) by mouth 2 (two) times daily as needed. 90 tablet 4   No current facility-administered medications for this visit.   No Known Allergies

## 2020-05-14 ENCOUNTER — Telehealth (INDEPENDENT_AMBULATORY_CARE_PROVIDER_SITE_OTHER): Payer: Commercial Managed Care - PPO | Admitting: Osteopathic Medicine

## 2020-05-14 ENCOUNTER — Encounter: Payer: Self-pay | Admitting: Osteopathic Medicine

## 2020-05-14 VITALS — Wt 165.4 lb

## 2020-05-14 DIAGNOSIS — H9203 Otalgia, bilateral: Secondary | ICD-10-CM

## 2020-05-14 DIAGNOSIS — J040 Acute laryngitis: Secondary | ICD-10-CM | POA: Diagnosis not present

## 2020-05-14 NOTE — Progress Notes (Signed)
Virtual Visit via Video (App used: MyChart) Note  I connected with      Danielle Kim on 05/14/20 at 1:16 PM  by a telemedicine application and verified that I am speaking with the correct person using two identifiers.  Patient is at home I am in office   I discussed the limitations of evaluation and management by telemedicine and the availability of in person appointments. The patient expressed understanding and agreed to proceed.  History of Present Illness: Danielle Kim is a 59 y.o. female who would like to discuss referral to ENT    Feeling better but throat and ears still hurting This is third bout of sore throat / laryngitis in the past year  Would like to see ENT ASAP if possible Job requires talking       Observations/Objective: Wt 165 lb 6.4 oz (75 kg)   LMP 04/28/2015   BMI 26.70 kg/m  BP Readings from Last 3 Encounters:  11/29/19 114/60  09/13/19 110/60  04/15/19 138/82   Exam: Normal Speech.  nad HOARSE BUT SPEAKING IN FULL SENTENCES OCCASIONAL COUGH   Lab and Radiology Results No results found for this or any previous visit (from the past 72 hour(s)). No results found.     Assessment and Plan: 59 y.o. female with The primary encounter diagnosis was Laryngitis. A diagnosis of Otalgia of both ears was also pertinent to this visit.  Offered steroid treatment vs wait and wee what ENT recommends. Pt would like to wait until sees ENT and I think this is reasonable.   PDMP not reviewed this encounter. Orders Placed This Encounter  Procedures  . Ambulatory referral to ENT    Referral Priority:   Urgent    Referral Type:   Consultation    Referral Reason:   Specialty Services Required    Requested Specialty:   Otolaryngology    Number of Visits Requested:   1   No orders of the defined types were placed in this encounter.  Patient Instructions       Follow Up Instructions: Return if symptoms worsen or fail to improve.    I discussed  the assessment and treatment plan with the patient. The patient was provided an opportunity to ask questions and all were answered. The patient agreed with the plan and demonstrated an understanding of the instructions.   The patient was advised to call back or seek an in-person evaluation if any new concerns, if symptoms worsen or if the condition fails to improve as anticipated.  15 minutes of non-face-to-face time was provided during this encounter.      . . . . . . . . . . . . . Marland Kitchen                   Historical information moved to improve visibility of documentation.  Past Medical History:  Diagnosis Date  . Anxiety   . CLL (chronic lymphocytic leukemia) ([place])   . Cluster headaches   . Depression   . Other specified glaucoma   . Pain around left eye     several bouts  sicne 2011.    Past Surgical History:  Procedure Laterality Date  . [place] SURGERY     2001  . REDUCTION MAMMAPLASTY     Social History   Tobacco Use  . Smoking status: Former Smoker    Types: Cigarettes  . Smokeless tobacco: Never Used  . Tobacco comment: quit 9 months ago  Substance Use Topics  . Alcohol use: Yes    Alcohol/week: 2.0 standard drinks    Types: 2 Standard drinks or equivalent per week   family history includes Diabetes in her father; Down syndrome in her sister; Heart attack in her mother; Stomach cancer in her maternal aunt; Stroke in her mother.  Medications: Current Outpatient Medications  Medication Sig Dispense Refill  . acyclovir cream (ZOVIRAX) 5 % Apply 1 application topically every 3 (three) hours. May use for up to 7 days 15 g 2  . ALPRAZolam (XANAX) 0.5 MG tablet 1 tablet daily prn for anxiety 20 tablet 0  . benzonatate (TESSALON) 100 MG capsule Take 100 mg by mouth 3 (three) times daily.    Marland Kitchen buPROPion (WELLBUTRIN SR) 150 MG 12 hr tablet TAKE 1 TABLET BY MOUTH TWICE A DAY 180 tablet 1  . doxycycline (VIBRA-TABS) 100 MG tablet Take 1  tablet (100 mg total) by mouth 2 (two) times daily. 14 tablet 0  . fluticasone (FLONASE) 50 MCG/ACT nasal spray Place 1 spray into both nostrils daily.    Marland Kitchen guaiFENesin-codeine (ROBITUSSIN AC) 100-10 MG/5ML syrup Take 5-10 mLs by mouth 4 (four) times daily as needed for cough. 180 mL 0  . Multiple Vitamin (MULTIVITAMIN) tablet Take 1 tablet by mouth daily.    . timolol (BETIMOL) 0.25 % ophthalmic solution 1-2 drops 2 (two) times daily.    . TRAVATAN Z 0.004 % SOLN ophthalmic solution PLACE 1 DROP INTO THE LEFT EYE NIGHTLY  5  . valACYclovir (VALTREX) 500 MG tablet Take 1 tablet (500 mg total) by mouth 2 (two) times daily as needed. 90 tablet 4   No current facility-administered medications for this visit.   No Known Allergies

## 2020-05-14 NOTE — Progress Notes (Signed)
Symptoms started 05/06/20:  SORE THROAT EAR PAIN  Feels better since visit on 05/09/20.  Requesting ENT referral

## 2020-05-31 ENCOUNTER — Emergency Department (INDEPENDENT_AMBULATORY_CARE_PROVIDER_SITE_OTHER): Payer: Commercial Managed Care - PPO

## 2020-05-31 ENCOUNTER — Emergency Department
Admission: RE | Admit: 2020-05-31 | Discharge: 2020-05-31 | Disposition: A | Discharge status: Home / Self Care | Payer: Commercial Managed Care - PPO | Source: Ambulatory Visit

## 2020-05-31 ENCOUNTER — Telehealth: Payer: Self-pay | Admitting: Emergency Medicine

## 2020-05-31 ENCOUNTER — Telehealth: Payer: Self-pay

## 2020-05-31 ENCOUNTER — Other Ambulatory Visit: Payer: Self-pay

## 2020-05-31 VITALS — BP 148/97 | HR 67 | Temp 97.6°F | Resp 18

## 2020-05-31 DIAGNOSIS — R059 Cough, unspecified: Secondary | ICD-10-CM

## 2020-05-31 DIAGNOSIS — J42 Unspecified chronic bronchitis: Secondary | ICD-10-CM

## 2020-05-31 MED ORDER — ALBUTEROL SULFATE HFA 108 (90 BASE) MCG/ACT IN AERS
1.0000 | INHALATION_SPRAY | Freq: Four times a day (QID) | RESPIRATORY_TRACT | 0 refills | Status: DC | PRN
Start: 2020-05-31 — End: 2022-02-26

## 2020-05-31 MED ORDER — IPRATROPIUM BROMIDE 0.06 % NA SOLN: 2.0000 | Freq: Four times a day (QID) | NASAL | 1 refills | Status: DC

## 2020-05-31 NOTE — Telephone Encounter (Signed)
Call to patient to see if she can come in earlier for evaluation based on her symptoms- pt made aware that 6pm was a very busy time and if patient required any further testing an earlier visit would be beneficial. Patient agreed to head over to [facility] in the next 20 mins or so.

## 2020-05-31 NOTE — Discharge Instructions (Signed)
°  You may try the inhaler every 6 hours as needed for dry cough or chest tightness.  You can also use the nasal spray to help with post-nasal drainage, which can trigger coughing.  Please call to schedule a follow up appointment with your primary care provider next week if not improving. You may need a referral to a pulmonologist.

## 2020-05-31 NOTE — Telephone Encounter (Signed)
Patient called stating it has been almost a month and she does not feel like the congestion and cough are getting better. She is taking Mucinex DM, robitussin, and benzonatate tablets daily with no relief. She states the chest congestion is just getting worse and she is worried about possibly needing some chest x-rays. She did not want to do virtual visit.   I advised patient to make appt to be seen at urgent care since they are able to see sick patients in office. Patient was agreeable and is going to be seen at urgent care.   FYI to PCP

## 2020-05-31 NOTE — ED Provider Notes (Signed)
Danielle Kim CARE    CSN: [id number] Arrival date & time: 05/31/20  1134      History   Chief Complaint Chief Complaint  Patient presents with  . Cough    HPI Danielle Kim is a 59 y.o. female.   HPI Danielle Kim is a 59 y.o. female presenting to UC with c/o continued cough for about 1 month. Pt was dx with laryngitis via video visit on 05/09/20 after being treated for a sinus infection around 05/04/20.  She completed another Evisit on 05/14/20 for continued sinus congestion Right side sinus pressure and ear pain. She was referred to ENT who dx her with inflammation of her larynx and prescribed a nasal spray, which she has not started yet. She was offered prednisone but states she has hx of CLL so she is trying to avoid steroids right now.  She called her PCP for a CXR but was advised to come to UC for an in-person visit. Denies fever, chills, n/v/d. No known hx of asthma or COPD. Former "mild" smoker. She has not needed an inhaler while sick in the past.   Past Medical History:  Diagnosis Date  . Anxiety   . CLL (chronic lymphocytic leukemia) ([place])   . Cluster headaches   . Depression   . Other specified glaucoma   . Pain around left eye     several bouts  sicne 2011.     Patient Active Problem List   Diagnosis Date Noted  . Numbness around mouth 09/23/2018  . DDD (degenerative disc disease), cervical 08/23/2018  . Primary osteoarthritis of first carpometacarpal joint of one hand, right 08/23/2018  . CLL (chronic lymphocytic leukemia) ([place]) 09/02/2017  . Trigger middle finger of right hand 11/07/2015  . Insomnia 03/31/2014  . Pain around left eye   . Secondary open-angle glaucoma of left eye, severe stage 02/07/2013    Past Surgical History:  Procedure Laterality Date  . [place] SURGERY     2001  . REDUCTION MAMMAPLASTY      OB History    Gravida  3   Para  3   Term      Preterm      AB      Living  3     SAB      TAB      Ectopic        Multiple      Live Births               Home Medications    Prior to Admission medications   Medication Sig Start Date End Date Taking? Authorizing Provider  acyclovir cream (ZOVIRAX) 5 % Apply 1 application topically every 3 (three) hours. May use for up to 7 days 09/13/19   Megan Salon, MD  albuterol (VENTOLIN HFA) 108 (90 Base) MCG/ACT inhaler Inhale 1-2 puffs into the lungs every 6 (six) hours as needed for wheezing or shortness of breath. 05/31/20   Noe Gens, PA-C  ALPRAZolam Duanne Moron) 0.5 MG tablet 1 tablet daily prn for anxiety 09/13/19   Megan Salon, MD  benzonatate (TESSALON) 100 MG capsule Take 100 mg by mouth 3 (three) times daily. 05/07/20   [provider]  buPROPion (WELLBUTRIN SR) 150 MG 12 hr tablet TAKE 1 TABLET BY MOUTH TWICE A DAY 12/21/19   Megan Salon, MD  doxycycline (VIBRA-TABS) 100 MG tablet Take 1 tablet (100 mg total) by mouth 2 (two) times daily. 05/09/20  Emeterio Reeve, DO  fluticasone [facility]) 50 MCG/ACT nasal spray Place 1 spray into both nostrils daily. 05/07/20   [provider]  guaiFENesin-codeine (ROBITUSSIN AC) 100-10 MG/5ML syrup Take 5-10 mLs by mouth 4 (four) times daily as needed for cough. 05/09/20   Emeterio Reeve, DO  ipratropium (ATROVENT) 0.06 % nasal spray Place 2 sprays into both nostrils 4 (four) times daily. 05/31/20   Noe Gens, PA-C  Multiple Vitamin (MULTIVITAMIN) tablet Take 1 tablet by mouth daily.    [provider]  timolol (BETIMOL) 0.25 % ophthalmic solution 1-2 drops 2 (two) times daily.    [provider]  TRAVATAN Z 0.004 % SOLN ophthalmic solution PLACE 1 DROP INTO THE LEFT EYE NIGHTLY 11/17/16   [provider]  valACYclovir (VALTREX) 500 MG tablet Take 1 tablet (500 mg total) by mouth 2 (two) times daily as needed. 09/13/19   Megan Salon, MD    Family History Family History  Problem Relation Age of Onset  . Diabetes Father   . Stroke Mother   .  Heart attack Mother   . Stomach cancer Maternal Aunt   . Down syndrome Sister   . Colon cancer Neg Hx     Social History Social History   Tobacco Use  . Smoking status: Former Smoker    Types: Cigarettes  . Smokeless tobacco: Never Used  . Tobacco comment: quit 9 months ago  Vaping Use  . Vaping Use: Never used  Substance Use Topics  . Alcohol use: Yes    Alcohol/week: 2.0 standard drinks    Types: 2 Standard drinks or equivalent per week  . Drug use: No     Allergies   Patient has no known allergies.   Review of Systems Review of Systems  Constitutional: Negative for chills and fever.  HENT: Positive for congestion and sore throat. Negative for ear pain, trouble swallowing and voice change.   Respiratory: Positive for cough. Negative for shortness of breath.   Cardiovascular: Negative for chest pain and palpitations.  Gastrointestinal: Negative for abdominal pain, diarrhea, nausea and vomiting.  Musculoskeletal: Negative for arthralgias, back pain and myalgias.  Skin: Negative for rash.  All other systems reviewed and are negative.    Physical Exam Triage Vital Signs ED Triage Vitals  Enc Vitals Group     BP 05/31/20 1145 (!) 148/97     Pulse Rate 05/31/20 1145 67     Resp 05/31/20 1145 18     Temp 05/31/20 1145 97.6 F (36.4 C)     Temp Source 05/31/20 1145 Oral     SpO2 05/31/20 1145 99 %     Weight --      Height --      Head Circumference --      Peak Flow --      Pain Score 05/31/20 1148 0     Pain Loc --      Pain Edu? --      Excl. in [place]? --    No data found.  Updated Vital Signs BP (!) 148/97 (BP Location: Right Arm)   Pulse 67   Temp 97.6 F (36.4 C) (Oral)   Resp 18   LMP 04/28/2015   SpO2 99%   Visual Acuity Right Eye Distance:   Left Eye Distance:   Bilateral Distance:    Right Eye Near:   Left Eye Near:    Bilateral Near:     Physical Exam Vitals and nursing note reviewed.  Constitutional:  General: She is not in  acute distress.    Appearance: Normal appearance. She is well-developed. She is not ill-appearing, toxic-appearing or diaphoretic.  HENT:     Head: Normocephalic and atraumatic.     Right Ear: Tympanic membrane and ear canal normal.     Left Ear: Tympanic membrane and ear canal normal.     Nose: Nose normal.     Mouth/Throat:     Lips: Pink.     Mouth: Mucous membranes are moist.     Pharynx: Oropharynx is clear. Uvula midline. No pharyngeal swelling, oropharyngeal exudate, posterior oropharyngeal erythema or uvula swelling.  Cardiovascular:     Rate and Rhythm: Normal rate and regular rhythm.  Pulmonary:     Effort: Pulmonary effort is normal. No respiratory distress.     Breath sounds: Normal breath sounds. No stridor. No wheezing, rhonchi or rales.  Musculoskeletal:        General: Normal range of motion.     Cervical back: Normal range of motion and neck supple. No tenderness.  Lymphadenopathy:     Cervical: No cervical adenopathy.  Skin:    General: Skin is warm and dry.  Neurological:     Mental Status: She is alert and oriented to person, place, and time.  Psychiatric:        Behavior: Behavior normal.      UC Treatments / Results  Labs (all labs ordered are listed, but only abnormal results are displayed) Labs Reviewed - No data to display  EKG   Radiology DG Chest 2 View  Result Date: 05/31/2020 CLINICAL DATA:  Productive cough for the past month. EXAM: CHEST - 2 VIEW COMPARISON:  05/29/2010. FINDINGS: Normal sized heart. Clear lungs with normal vascularity. Stable mild peribronchial thickening. Stable mild scoliosis and cervical spine fixation hardware. IMPRESSION: No acute abnormality.  Stable mild chronic bronchitic changes. Electronically Signed   By: Claudie Revering M.D.   On: 05/31/2020 12:08    Procedures Procedures (including critical care time)  Medications Ordered in UC Medications - No data to display  Initial Impression / Assessment and Plan / UC  Course  I have reviewed the triage vital signs and the nursing notes.  Pertinent labs & imaging results that were available during my care of the patient were reviewed by me and considered in my medical decision making (see chart for details).     Discussed imaging with pt Will have pt try trial of ipratropium nasal spray for post-nasal drainage and albuterol inhaler F/u with PCP May need referral to pulmonology AVS given  Final Clinical Impressions(s) / UC Diagnoses   Final diagnoses:  Cough  Chronic bronchitis, unspecified chronic bronchitis type ([place])     Discharge Instructions      You may try the inhaler every 6 hours as needed for dry cough or chest tightness.  You can also use the nasal spray to help with post-nasal drainage, which can trigger coughing.  Please call to schedule a follow up appointment with your primary care provider next week if not improving. You may need a referral to a pulmonologist.     ED Prescriptions    Medication Sig Dispense Auth. Provider   ipratropium (ATROVENT) 0.06 % nasal spray Place 2 sprays into both nostrils 4 (four) times daily. 15 mL Fatisha Rabalais O, PA-C   albuterol (VENTOLIN HFA) 108 (90 Base) MCG/ACT inhaler Inhale 1-2 puffs into the lungs every 6 (six) hours as needed for wheezing or shortness of breath. 18 g  Noe Gens, PA-C     PDMP not reviewed this encounter.   Noe Gens, [place] 05/31/20 1408

## 2020-05-31 NOTE — ED Triage Notes (Addendum)
Pt c/o cough since 10/08. Televisit following Sunday. Was given tessalon. Laryngitis x 2 weeks. Was tx for sinus infection by PCP 3 weeks ago. Also saw ENT 2 weeks ago. Said she received an ear wash and was told vocal cords were inflamed. Delsym and codeine cough at night. Mostly dry cough but has been coughing up mucous sometimes.   Has had covid vaccinations. (March/April)

## 2020-06-05 ENCOUNTER — Other Ambulatory Visit: Payer: Self-pay | Admitting: Obstetrics & Gynecology

## 2020-06-20 ENCOUNTER — Ambulatory Visit: Payer: Commercial Managed Care - PPO | Admitting: Osteopathic Medicine

## 2020-06-20 ENCOUNTER — Encounter: Payer: Self-pay | Admitting: Obstetrics & Gynecology

## 2020-06-20 ENCOUNTER — Encounter: Payer: Self-pay | Admitting: Osteopathic Medicine

## 2020-06-20 VITALS — BP 150/84 | HR 70 | Temp 97.0°F | Ht 66.0 in | Wt 166.0 lb

## 2020-06-20 DIAGNOSIS — R053 Chronic cough: Secondary | ICD-10-CM | POA: Diagnosis not present

## 2020-06-20 MED ORDER — GUAIFENESIN-CODEINE 100-10 MG/5ML PO SYRP: 5.0000 mL | ORAL_SOLUTION | Freq: Four times a day (QID) | ORAL | 0 refills | Status: DC | PRN

## 2020-06-20 MED ORDER — PANTOPRAZOLE SODIUM 40 MG PO TBEC: 40.0000 mg | DELAYED_RELEASE_TABLET | Freq: Every day | ORAL | 3 refills | Status: DC

## 2020-06-20 MED ORDER — PREDNISONE 20 MG PO TABS: 20.0000 mg | ORAL_TABLET | Freq: Two times a day (BID) | ORAL | 0 refills | Status: DC

## 2020-06-20 NOTE — Patient Instructions (Signed)
Will refill cough meds Start Prednisone (steroid) Start Pantoprazole (antacid) for 6-8 weeks  OK to continue Claritin  OK to start Flonase  If worse, let me know!  If NO better after 1-2 weeks, let me know!

## 2020-06-20 NOTE — Progress Notes (Signed)
Danielle Kim is a 59 y.o. female who presents to  [facility]  today, 06/20/20, seeking care for the following:   Ongoing issues w/ cough about 6 weeks. Associated w/ R ear and R throat pain, hurts to swallow. At this point, NO sinus pain/pressure. No fever. No LN enlargement. No restriction to ROM in neck/jaw.   Laryngitis and sinusitis 05/09/20 and 05/14/20 --> doxycycline, guaifenesin-codeine. Offered prednisone burst at follow up when referral placed to ENT, pt wanted to wait see what they said.   ENT visit no records available sound like again declined systemic steroid tx, ENT offered trial steroid nasal spray   UC visit 20 days ago --> ipratropium nasal spray, albutrerol, consider pulmonary f/u   Seeing ophtho for glaucoma - reports R eye mucus issues which cleared up temporarily but restarted last night.     ASSESSMENT & PLAN with other pertinent findings:  The encounter diagnosis was Chronic cough.   No results found for this or any previous visit (from the past 24 hour(s)).  On exam: normal HEENT with TM clear bilaterally and canals WNL, nasal passages normal, normal pharynx. Lungs CTABL    Patient Instructions  Will refill cough meds Start Prednisone (steroid) Start Pantoprazole (antacid) for 6-8 weeks  OK to continue Claritin  OK to start Flonase  If worse, let me know!  If NO better after 1-2 weeks, let me know!      No orders of the defined types were placed in this encounter.   Meds ordered this encounter  Medications  . predniSONE (DELTASONE) 20 MG tablet    Sig: Take 1 tablet (20 mg total) by mouth 2 (two) times daily with a meal.    Dispense:  10 tablet    Refill:  0  . pantoprazole (PROTONIX) 40 MG tablet    Sig: Take 1 tablet (40 mg total) by mouth daily.    Dispense:  30 tablet    Refill:  3  . guaiFENesin-codeine (ROBITUSSIN AC) 100-10 MG/5ML syrup    Sig: Take 5-10 mLs by mouth 4  (four) times daily as needed for cough.    Dispense:  180 mL    Refill:  0       Follow-up instructions: Return if symptoms worsen or fail to improve.                                         BP (!) 150/84   Pulse 70   Temp (!) 97 F (36.1 C) (Oral)   Ht 5\' 6"  (1.676 m)   Wt 166 lb (75.3 kg)   LMP 04/28/2015   SpO2 94%   BMI 26.79 kg/m   Current Meds  Medication Sig  . albuterol (VENTOLIN HFA) 108 (90 Base) MCG/ACT inhaler Inhale 1-2 puffs into the lungs every 6 (six) hours as needed for wheezing or shortness of breath.  . ALPRAZolam (XANAX) 0.5 MG tablet 1 tablet daily prn for anxiety  . buPROPion (WELLBUTRIN SR) 150 MG 12 hr tablet TAKE 1 TABLET BY MOUTH TWICE A DAY  . Multiple Vitamin (MULTIVITAMIN) tablet Take 1 tablet by mouth daily.  . timolol (BETIMOL) 0.25 % ophthalmic solution 1-2 drops 2 (two) times daily.  . TRAVATAN Z 0.004 % SOLN ophthalmic solution PLACE 1 DROP INTO THE LEFT EYE NIGHTLY  . valACYclovir (VALTREX) 500 MG tablet  Take 1 tablet (500 mg total) by mouth 2 (two) times daily as needed.    No results found for this or any previous visit (from the past 72 hour(s)).  No results found.     All questions at time of visit were answered - patient instructed to contact office with any additional concerns or updates.  ER/RTC precautions were reviewed with the patient as applicable.   Please note: voice recognition software was used to produce this document, and typos may escape review. Please contact Dr. Sheppard Coil for any needed clarifications.

## 2020-06-29 ENCOUNTER — Encounter: Payer: Self-pay | Admitting: Nurse Practitioner

## 2020-06-29 ENCOUNTER — Other Ambulatory Visit: Payer: Self-pay

## 2020-06-29 ENCOUNTER — Ambulatory Visit: Payer: Commercial Managed Care - PPO | Admitting: Nurse Practitioner

## 2020-06-29 VITALS — BP 127/82 | HR 67 | Temp 99.5°F | Ht 66.0 in | Wt 164.8 lb

## 2020-06-29 DIAGNOSIS — R5383 Other fatigue: Secondary | ICD-10-CM

## 2020-06-29 DIAGNOSIS — J029 Acute pharyngitis, unspecified: Secondary | ICD-10-CM | POA: Diagnosis not present

## 2020-06-29 DIAGNOSIS — R509 Fever, unspecified: Secondary | ICD-10-CM | POA: Diagnosis not present

## 2020-06-29 LAB — POCT RAPID STREP A (OFFICE): Rapid Strep A Screen: NEGATIVE

## 2020-06-29 MED ORDER — PREDNISONE 20 MG PO TABS: 20.0000 mg | ORAL_TABLET | Freq: Every day | ORAL | 0 refills | Status: DC

## 2020-06-29 NOTE — Progress Notes (Signed)
Acute Office Visit  Subjective:    Patient ID: Danielle Kim, female    DOB: 1961/04/23, 59 y.o.   MRN: [id number]  Chief Complaint  Patient presents with   Sore Throat    HPI Patient is in today for ongoing concerns with sore throat since [name] October of this year.  Pertinent visits include: End of September: Dental visit indicating sinusitis (reported- no records available)- treatment with antibiotics 10/13 PC Visit: laryngitis and sinusitis- treatment with doxycycline, guaifenesin-codeine, fluticasone 10/18- PC Visit: sinusitis- ENT referral, steroids offerred, but pt declined 11/4- UC Visit: Cough- treatment with albuterol, ipratropium bromide nasal spray, steroids offered, but pt declined, CXR shows stable mild bronchitic changes 11/?- ENT Visit-  Ears cleaned out  11/24- PC Visit Chronic Cough- prednisone, pantoprazole, and guaifenesin-codeine ordered  Today she reports: chills, sore throat, cough with small amount of green mucous only upon awakening, and excess fatigue despite sleeping well.  She also endorses mild abdominal pain. She reports that while she was taking steroids her pain resolved completely and she felt good. The day after steroids were completed she reports that her throat began to hurt again and it has not stopped since.   Denies known fever, congestion, rhinorrhea, loss of taste, loss of smell, body aches, chest pain, n/v/d, palpitations, shortness of breath, headache, sinus pain/pressure, ear pain/pressure  She denies known sick contacts, but does report that she was around her young nieces recently and may have picked something up from them.   She has been taking: pantoprazole daily and sleeping on a pillow wedge to elevate the head of the bed.   She endorses frustration over continued sore throat symptoms despite continued treatment.   Past Medical History:  Diagnosis Date   Anxiety    CLL (chronic lymphocytic leukemia) ([facility])    Cluster headaches     Depression    Other specified glaucoma    Pain around left eye     several bouts  sicne 2011.     Past Surgical History:  Procedure Laterality Date   CERVICAL DISC SURGERY     2001   REDUCTION MAMMAPLASTY      Family History  Problem Relation Age of Onset   Diabetes Father    Stroke Mother    Heart attack Mother    Stomach cancer Maternal Aunt    Down syndrome Sister    Colon cancer Neg Hx     Social History   Socioeconomic History   Marital status: Divorced    Spouse name: Not on file   Number of children: 3   Years of education: 15   Highest education level: Not on file  Occupational History   Occupation: Company secretary: [employer]  Tobacco Use   Smoking status: Former Smoker    Types: Cigarettes   Smokeless tobacco: Never Used   Tobacco comment: quit 9 months ago  Vaping Use   Vaping Use: Never used  Substance and Sexual Activity   Alcohol use: Yes    Alcohol/week: 2.0 standard drinks    Types: 2 Standard drinks or equivalent per week   Drug use: No   Sexual activity: Yes    Partners: Male    Birth control/protection: Post-menopausal  Other Topics Concern   Not on file  Social History Narrative   Patient is a single mother. Patient works for [employer]. Patient has college education.   Caffeine- three cups coffee daily.   Left handed.  Social Determinants of Health   Financial Resource Strain:    Difficulty of Paying Living Expenses: Not on file  Food Insecurity:    Worried About Charity fundraiser in the Last Year: Not on file   [employer] of Food in the Last Year: Not on file  Transportation Needs:    Lack of Transportation (Medical): Not on file   Lack of Transportation (Non-Medical): Not on file  Physical Activity:    Days of Exercise per Week: Not on file   Minutes of Exercise per Session: Not on file  Stress:    Feeling of Stress : Not on file  Social Connections:     Frequency of Communication with Friends and Family: Not on file   Frequency of Social Gatherings with Friends and Family: Not on file   Attends Religious Services: Not on file   Active Member of Clubs or Organizations: Not on file   Attends Archivist Meetings: Not on file   Marital Status: Not on file  Intimate Partner Violence:    Fear of Current or Ex-Partner: Not on file   Emotionally Abused: Not on file   Physically Abused: Not on file   Sexually Abused: Not on file    Outpatient Medications Prior to Visit  Medication Sig Dispense Refill   albuterol (VENTOLIN HFA) 108 (90 Base) MCG/ACT inhaler Inhale 1-2 puffs into the lungs every 6 (six) hours as needed for wheezing or shortness of breath. 18 g 0   ALPRAZolam (XANAX) 0.5 MG tablet 1 tablet daily prn for anxiety 20 tablet 0   buPROPion (WELLBUTRIN SR) 150 MG 12 hr tablet TAKE 1 TABLET BY MOUTH TWICE A DAY 180 tablet 1   guaiFENesin-codeine (ROBITUSSIN AC) 100-10 MG/5ML syrup Take 5-10 mLs by mouth 4 (four) times daily as needed for cough. 180 mL 0   Multiple Vitamin (MULTIVITAMIN) tablet Take 1 tablet by mouth daily.     pantoprazole (PROTONIX) 40 MG tablet Take 1 tablet (40 mg total) by mouth daily. 30 tablet 3   predniSONE (DELTASONE) 20 MG tablet Take 1 tablet (20 mg total) by mouth 2 (two) times daily with a meal. 10 tablet 0   timolol (BETIMOL) 0.25 % ophthalmic solution 1-2 drops 2 (two) times daily.     TRAVATAN Z 0.004 % SOLN ophthalmic solution PLACE 1 DROP INTO THE LEFT EYE NIGHTLY  5   valACYclovir (VALTREX) 500 MG tablet Take 1 tablet (500 mg total) by mouth 2 (two) times daily as needed. 90 tablet 4   No facility-administered medications prior to visit.    No Known Allergies  Review of Systems All review of systems negative except what is listed in the HPI     Objective:    Physical Exam Vitals and nursing note reviewed.  Constitutional:      General: She is not in acute  distress.    Appearance: She is well-developed. She is not ill-appearing.  HENT:     Head: Normocephalic and atraumatic.     Right Ear: Ear canal normal. A middle ear effusion is present. No mastoid tenderness. Tympanic membrane is injected and bulging.     Left Ear: Ear canal normal. A middle ear effusion is present. No mastoid tenderness. Tympanic membrane is injected and bulging.     Nose: Mucosal edema present. No rhinorrhea.     Right Turbinates: Enlarged and swollen.     Left Turbinates: Enlarged and swollen.     Right Sinus: No maxillary sinus  tenderness or frontal sinus tenderness.     Left Sinus: No maxillary sinus tenderness or frontal sinus tenderness.     Mouth/Throat:     Mouth: Mucous membranes are moist.     Dentition: Normal dentition. No gingival swelling.     Tongue: No lesions. Tongue does not deviate from midline.     Palate: No mass and lesions.     Pharynx: Oropharynx is clear. Uvula midline. Posterior oropharyngeal erythema present.     Tonsils: No tonsillar exudate or tonsillar abscesses.  Eyes:     Conjunctiva/sclera: Conjunctivae normal.     Pupils: Pupils are equal, round, and reactive to light.  Cardiovascular:     Rate and Rhythm: Normal rate and regular rhythm.     Heart sounds: Normal heart sounds.  Pulmonary:     Effort: Pulmonary effort is normal. No respiratory distress.     Breath sounds: Normal breath sounds. No stridor. No wheezing or rhonchi.  Abdominal:     General: Abdomen is flat. Bowel sounds are normal. There is no distension.     Palpations: Abdomen is soft. There is no shifting dullness, hepatomegaly, splenomegaly or mass.     Tenderness: There is no abdominal tenderness.  Musculoskeletal:     Cervical back: Normal range of motion and neck supple.  Lymphadenopathy:     Cervical: Cervical adenopathy present.  Skin:    General: Skin is warm and dry.     Capillary Refill: Capillary refill takes less than 2 seconds.  Neurological:      General: No focal deficit present.     Mental Status: She is alert and oriented to person, place, and time.  Psychiatric:        Mood and Affect: Mood normal.        Behavior: Behavior normal.     LMP 04/28/2015  Wt Readings from Last 3 Encounters:  06/20/20 166 lb (75.3 kg)  05/14/20 165 lb 6.4 oz (75 kg)  12/20/19 164 lb 1.6 oz (74.4 kg)    Health Maintenance Due  Topic Date Due   INFLUENZA VACCINE  02/26/2020   MAMMOGRAM  06/29/2020    There are no preventive care reminders to display for this patient.   Lab Results  Component Value Date   TSH 1.25 03/22/2019   Lab Results  Component Value Date   WBC 25.4 (HH) 09/13/2019   HGB 14.5 09/13/2019   HCT 42.5 09/13/2019   MCV 90 09/13/2019   PLT 260 09/13/2019   Lab Results  Component Value Date   NA 144 09/13/2019   K 4.2 09/13/2019   CO2 23 09/13/2019   GLUCOSE 104 (H) 09/13/2019   BUN 12 09/13/2019   CREATININE 0.91 09/13/2019   BILITOT 0.3 09/13/2019   ALKPHOS 111 09/13/2019   AST 29 09/13/2019   ALT 32 09/13/2019   PROT 7.4 09/13/2019   ALBUMIN 4.7 09/13/2019   CALCIUM 9.5 09/13/2019   Lab Results  Component Value Date   CHOL 189 03/22/2019   Lab Results  Component Value Date   HDL 64 03/22/2019   Lab Results  Component Value Date   LDLCALC 109 (H) 03/22/2019   Lab Results  Component Value Date   TRIG 70 03/22/2019   Lab Results  Component Value Date   CHOLHDL 3.0 03/22/2019   No results found for: HGBA1C     Assessment & Plan:   1. Sore throat 2. Fever, unspecified fever cause 3. Fatigue, unspecified type Sore throat ongoing for greater  than 8 weeks despite multiple treatment options and ENT referral. It remains unclear the etiology of her symptoms, but I am suspicious for possible post nasal drip exacerbating symptoms given the inflammation noted in the nasal turbinates. She is running a low grade fever in the office today.  She has not been tested for strep throat or  mononucleosis at this time. Will plan to test for these today to rule of this as infectious sources. Given the length of time symptoms have been present and the associated fatigue, CMV or EBV may be a likely cause.  Recommend restarting fluticasone nasal spray and utilizing daily for at least 30 days to see if this will help with the inflammation within the nasal passages and relief suspected post nasal drip. Will restart prednisone burst for 5 days given the relief this provided.  If tests are negative, will plan to try Zyzal or other antihistamine to see if allergic symptoms could be contributing to her symptoms.  - Monospot - Grp A Strep - POCT rapid strep A  Will schedule follow-up based on test results.  Rapid strep negative in the office today- will send for culture.  Will add CMV testing to Monospot, if possible.   Orma Render, NP

## 2020-06-29 NOTE — Patient Instructions (Signed)
Infectious Mononucleosis Infectious mononucleosis is an infection that is caused by a virus. This illness is often called "mono." It can spread from person to person (is contagious). Mono is usually not serious. It often goes away in 2-4 weeks without treatment. In rare cases, the illness can be bad and last longer. What are the causes? This condition is caused by the Epstein-Barr virus. This virus spreads through:  Contact with a sick person's saliva or other body fluids. This can happen through: ? Kissing. ? Having sex. ? Coughing. ? Sneezing.  Sharing forks, spoons, knives (utensils), or drinking glasses with a person who is sick.  Receiving blood from a person who has mono (blood transfusion).  Receiving an organ from a person who has mono (organ transplant). What increases the risk? You are more likely to develop this condition if:  You are 11-50 years old. What are the signs or symptoms? You may get symptoms 4-6 weeks after infection. Symptoms may start slowly and happen at different times. Common symptoms include:  Sore throat.  Headache.  Being very tired (fatigued).  Pain in the muscles.  Swollen glands.  Fever.  No desire for food.  Rash. Other symptoms include:  A liver or spleen that is larger than normal.  Feeling sick to your stomach (nauseous).  Throwing up (vomiting).  Pain in the belly (abdomen). How is this treated? There is no cure for this condition. Mono usually goes away on its own with time. Treatment can help relieve symptoms and may include:  Taking medicines to treat pain and fever.  Drinking plenty of fluids.  Getting a lot of rest.  Taking medicines to treat swelling (corticosteroids). In some very bad cases, treatment may have to be given in a hospital. Follow these instructions at home: Medicines  Take over-the-counter and prescription medicines only as told by your doctor.  Do not take ampicillin or amoxicillin. This may  cause a rash.  Do not take aspirin if you are under 18. Activity  Rest as needed.  Do not do any of the following activities until your doctor says that they are safe for you: ? Contact sports. You may need to wait at least 1 month before you play sports. ? Exercise that uses a lot of energy. ? Lifting heavy things.  Slowly go back to your normal activities after your fever is gone, or when your doctor says that you can. Be sure to rest when you get tired. General instructions   Avoid kissing or sharing forks, spoons, knives, or drinking cups until your doctor says that you can.  Drink enough fluid to keep your pee (urine) pale yellow.  Do not drink alcohol.  If you have a sore throat: ? Rinse your mouth (gargle) with salt water 3-4 times a day or as needed. To make salt water, dissolve -1 tsp (3-6 g) of salt in 1 cup (237 mL) of warm water. ? Eat soft foods. Cold foods such as ice cream or ice pops can help your throat feel better. ? Try sucking on hard candy.  Wash your hands often with soap and water. If you cannot use soap and water, use hand sanitizer.  Keep all follow-up visits as told by your doctor. This is important. How is this prevented?   Avoid contact with people who have mono. A person who has mono may not seem sick, but he or she can still spread the virus.  Avoid sharing forks, spoons, knives, drinking cups, or toothbrushes.  Wash your hands often with soap and water. If you cannot use soap and water, use hand sanitizer.  Use the inside of your elbow to cover your mouth when you cough or sneeze. Contact a doctor if:  Your fever is not gone after 10 days.  You have swelling by your jaw or neck (swollen lymph nodes), and the swelling does not go away after 4 weeks.  Your activity level is not back to normal after 2 months.  Your skin or the white parts of your eyes turn yellow (jaundice).  You have trouble pooping (constipation). This may mean that  you: ? Poop (have a bowel movement) fewer times in a week than normal. ? Have a hard time pooping. ? Have poop that is dry, hard, or bigger than normal. Get help right away if:  You have very bad pain in your: ? Belly. ? Shoulder.  You are drooling.  You have trouble swallowing.  You have trouble breathing.  You have a stiff neck.  You have a very bad headache.  You cannot stop throwing up.  You have jerky movements that you cannot control (seizures).  You are mixed up (confused).  You have trouble with balance.  Your nose or gums start to bleed.  You have signs of not having enough water in your body (dehydration). These may include: ? Weakness. ? Sunken eyes. ? Pale skin. ? Dry mouth. ? Fast breathing or heartbeat. Summary  Infectious mononucleosis, or "mono," is an infection that is caused by a virus.  Mono is usually not serious, but some people may need to be treated for it in the hospital.  You should not play contact sports or lift heavy things until your doctor says that you can.  Wash your hands often with soap and water. If you cannot use soap and water, use hand sanitizer. This information is not intended to replace advice given to you by your health care provider. Make sure you discuss any questions you have with your health care provider. Document Revised: 04/28/2018 Document Reviewed: 04/28/2018 Elsevier Patient Education  [place].   Strep Throat, Adult Strep throat is an infection of the throat. It is caused by germs (bacteria). Strep throat is common during the cold months of the year. It mostly affects children who are 57-92 years old. However, people of all ages can get it at any time of the year. When strep throat affects the tonsils, it is called tonsillitis. When it affects the back of the throat, it is called pharyngitis. This infection spreads from person to person through coughing, sneezing, or having close contact. What are the  causes? This condition is caused by the Streptococcus pyogenes germ. What increases the risk? You are more likely to develop this condition if:  You care for young children. Children are more likely to get strep throat and may spread it to others.  You go to crowded places. Germs can spread easily in such places.  You kiss or touch someone who has strep throat. What are the signs or symptoms? Symptoms of this condition include:  Fever or chills.  Redness, swelling, or pain in the tonsils or throat.  Pain or trouble when swallowing.  White or yellow spots on the tonsils or throat.  Tender glands in the neck and under the jaw.  Bad breath.  Red rash all over the body. This is rare. How is this treated? This condition may be treated with:  Medicines that kill germs (antibiotics).  Medicines that treat pain or fever. These include: ? Ibuprofen or acetaminophen. ? Aspirin, only for patients who are over the age of 37. ? Throat lozenges. ? Throat sprays. Follow these instructions at home: Medicines   Take over-the-counter and prescription medicines only as told by your doctor.  Take your antibiotic medicine as told by your doctor. Do not stop taking the antibiotic even if you start to feel better. Eating and drinking   If you have trouble swallowing, eat soft foods until your throat feels better.  Drink enough fluid to keep your pee (urine) pale yellow.  To help with pain, you may have: ? Warm fluids, such as soup and tea. ? Cold fluids, such as frozen desserts or popsicles. General instructions  Rinse your mouth (gargle) with a salt-water mixture 3-4 times a day or as needed. To make a salt-water mixture, dissolve -1 tsp (3-6 g) of salt in 1 cup (237 mL) of warm water.  Rest as much as you can.  Stay home from work or school until you have been taking antibiotics for 24 hours.  Avoid smoking or being around people who smoke.  Keep all follow-up visits as  told by your doctor. This is important. How is this prevented?   Do not share food, drinking cups, or personal items. They can cause the germs to spread.  Wash your hands well with soap and water. Make sure that all people in your house wash their hands well.  Have family members tested if they have a fever or a sore throat. They may need an antibiotic if they have strep throat. Contact a doctor if:  You have swelling in your neck that keeps getting bigger.  You get a rash, cough, or earache.  You cough up a thick fluid that is green, yellow-brown, or bloody.  You have pain that does not get better with medicine.  Your symptoms get worse instead of getting better.  You have a fever. Get help right away if:  You vomit.  You have a very bad headache.  Your neck hurts or feels stiff.  You have chest pain or are short of breath.  You have drooling, very bad throat pain, or changes in your voice.  Your neck is swollen, or the skin gets red and tender.  Your mouth is dry, or you are peeing less than normal.  You keep feeling more tired or have trouble waking up.  Your joints are red or painful. Summary  Strep throat is an infection of the throat. It is caused by germs (bacteria).  This infection can spread from person to person through coughing, sneezing, or having close contact.  Take your medicines, including antibiotics, as told by your doctor. Do not stop taking the antibiotic even if you start to feel better.  To prevent the spread of germs, wash your hands well with soap and water. Have others do the same. Do not share food, drinking cups, or personal items.  Get help right away if you have a bad headache, chest pain, shortness of breath, a stiff or painful neck, or you vomit. This information is not intended to replace advice given to you by your health care provider. Make sure you discuss any questions you have with your health care provider. Document Revised:  10/01/2018 Document Reviewed: 10/01/2018 Elsevier Patient Education  [place].

## 2020-06-30 LAB — MONONUCLEOSIS SCREEN: Heterophile, Mono Screen: NEGATIVE

## 2020-07-02 LAB — TIQ- AMBIGUOUS ORDER

## 2020-07-02 NOTE — Progress Notes (Signed)
[name],   Your mono test came back negative as did the rapid strep test. I am still waiting on the other strep test to come back, but will notify you as soon as it is in.   I am wondering if the symptoms are caused by an allergen or post nasal drip. It would be worth adding an allergy medication such as allegra, claritin, or zyrtec to your daily regimen for a few weeks to see if the symptoms go away.  Keep using the flonase, also. This can take a week or so to really have an affect.   Please keep myself or Dr. Sheppard Coil posted on your symptoms.

## 2020-07-03 LAB — TEST AUTHORIZATION: TEST CODE:: 4485

## 2020-07-03 LAB — STREP A DNA PROBE

## 2020-07-03 LAB — CULTURE, GROUP A STREP
MICRO NUMBER:: 11286240
SPECIMEN QUALITY:: ADEQUATE

## 2020-07-03 LAB — MONONUCLEOSIS SCREEN

## 2020-07-06 ENCOUNTER — Telehealth: Payer: Self-pay

## 2020-07-06 NOTE — Telephone Encounter (Addendum)
Unfortunately, we cannot continue the oral steroids as there are many risks associated with prolonged use. At this time it seems that this is related to an inflammatory process since the steroids help. I would like to give the flonase and antihistamine a little longer to see how this works. Sometimes it can take a couple of weeks for these to reach full effect. She may also want to consider adding a medication for GERD in the event silent reflux is causing some of the symptoms. I recommend something like famotidine or even something a little stronger like Nexium.  If this is still not effective, then we may need to refer back to ENT for further evaluation.

## 2020-07-06 NOTE — Telephone Encounter (Signed)
Danielle Kim states her sore throat on right side and right ear pain has returned. She is taking the antihistamine and flonase. She states she felt better on the prednisone. She has had 1 round of antibiotic and 2 rounds of prednisone. She was seen by ENT. She would like the next steps.

## 2020-07-09 ENCOUNTER — Telehealth: Payer: Self-pay

## 2020-07-09 NOTE — Telephone Encounter (Signed)
Spoke with patient. Patient is requesting to continue intermittent FMLA started by Dr. 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  Advised patient Dr. Sabra Heck no longer at [facility], would need to f/u with PCP or psychiatrist for further evaluation and assistance with intermittent FMLA. Patient states she plans to transfer her care to [facility], will f/u with Dr. Sabra Heck, contact information provided. Questions answered. Patient thankful for return call.   Routing to pDr. Talbert Nan for final review. Patient is agreeable to disposition. Will close encounter.

## 2020-07-09 NOTE — Telephone Encounter (Signed)
Routing to [place], Sharee Pimple

## 2020-07-09 NOTE — Telephone Encounter (Signed)
Patient would like to speak to nurse about getting intermittant FMLA paperwork fill out. Dr Sabra Heck has done this before for patient to be out of work due to the passing of son.

## 2020-07-10 NOTE — Telephone Encounter (Signed)
Patient would like to go back to ENT.

## 2020-07-12 NOTE — Telephone Encounter (Signed)
Left message advising of recommendation.

## 2020-07-12 NOTE — Telephone Encounter (Signed)
Since she is already established with ENT, she can call their office and and make the follow-up. Unless she would like a new referral for a different provider. Thank you.

## 2020-08-09 ENCOUNTER — Encounter: Payer: Self-pay | Admitting: Osteopathic Medicine

## 2020-08-09 ENCOUNTER — Other Ambulatory Visit: Payer: Self-pay

## 2020-08-09 ENCOUNTER — Ambulatory Visit (INDEPENDENT_AMBULATORY_CARE_PROVIDER_SITE_OTHER): Payer: Commercial Managed Care - PPO | Admitting: Osteopathic Medicine

## 2020-08-09 VITALS — BP 110/76 | HR 66 | Temp 98.1°F | Wt 168.0 lb

## 2020-08-09 DIAGNOSIS — Z0000 Encounter for general adult medical examination without abnormal findings: Secondary | ICD-10-CM

## 2020-08-09 DIAGNOSIS — Z1231 Encounter for screening mammogram for malignant neoplasm of breast: Secondary | ICD-10-CM

## 2020-08-09 NOTE — Patient Instructions (Addendum)
General Preventive Care  Most recent routine screening labs: ordered.   Blood pressure goal 130/80 or less.   Tobacco: don't!   Alcohol: responsible moderation is ok for most adults - if you have concerns about your alcohol intake, please talk to me!   Exercise: as tolerated to reduce risk of cardiovascular disease and diabetes. Strength training will also prevent osteoporosis.   Mental health: if need for mental health care (medicines, counseling, other), or concerns about moods, please let me know!   Sexual / Reproductive health: if need for STD testing, or if concerns with libido/pain problems, please let me know! If you need to discuss family planning, please let me know!   Advanced Directive: Living Will and/or Healthcare Power of Attorney recommended for all adults, regardless of age or health.  Vaccines  Flu vaccine: recommended every fall/winter  Shingles vaccine: all done!  Pneumonia vaccines: booster after age 45.  Tetanus booster: every 10 years - 2030  COVID vaccine: THANKS for getting your vaccine! :)  Cancer screenings   Colon cancer screening: for everyone age 59-75. Colonoscopy every 10 years if normal   Breast cancer screening: mammogram annually   Cervical cancer screening: Pap due 08/2022  Lung cancer screening: CT chest every year for those aged 84 to 18 years who have a 20 pack-year smoking history and currently smoke or have quit within the past 15 years  Infection screenings  . HIV: recommended screening at least once age 77-65 . Gonorrhea/Chlamydia: screening as needed . Hepatitis C: recommended once for everyone age 62-75 . TB: certain at-risk populations Other . Bone Density Test: recommended for women at age 70, men at age 82, sooner depending on risk factors

## 2020-08-09 NOTE — Progress Notes (Signed)
Danielle Kim is a 60 y.o. female who presents to  [place] at [facility]  today, 08/09/20, seeking care for the following:  . Annual physical      ASSESSMENT & PLAN with other pertinent findings:  The primary encounter diagnosis was Annual physical exam. A diagnosis of Breast cancer screening by mammogram was also pertinent to this visit.   No results found for this or any previous visit (from the past 24 hour(s)).  Patient Instructions  General Preventive Care  Most recent routine screening labs: ordered.   Blood pressure goal 130/80 or less.   Tobacco: don't!   Alcohol: responsible moderation is ok for most adults - if you have concerns about your alcohol intake, please talk to me!   Exercise: as tolerated to reduce risk of cardiovascular disease and diabetes. Strength training will also prevent osteoporosis.   Mental health: if need for mental health care (medicines, counseling, other), or concerns about moods, please let me know!   Sexual / Reproductive health: if need for STD testing, or if concerns with libido/pain problems, please let me know! If you need to discuss family planning, please let me know!   Advanced Directive: Living Will and/or Healthcare Power of Attorney recommended for all adults, regardless of age or health.  Vaccines  Flu vaccine: recommended every fall/winter  Shingles vaccine: all done!  Pneumonia vaccines: booster after age 23.  Tetanus booster: every 10 years - 2030  COVID vaccine: THANKS for getting your vaccine! :)  Cancer screenings   Colon cancer screening: for everyone age 57-75. Colonoscopy every 10 years if normal   Breast cancer screening: mammogram annually   Cervical cancer screening: Pap due 08/2022  Lung cancer screening: CT chest every year for those aged 20 to 33 years who have a 20 pack-year smoking history and currently smoke or have quit within the past 15 years   Infection screenings  . HIV: recommended screening at least once age 44-65 . Gonorrhea/Chlamydia: screening as needed . Hepatitis C: recommended once for everyone age 54-75 . TB: certain at-risk populations Other . Bone Density Test: recommended for women at age 79, men at age 17, sooner depending on risk factors   Orders Placed This Encounter  Procedures  . MM 3D SCREEN BREAST BILATERAL  . CBC  . COMPLETE METABOLIC PANEL WITH GFR  . Lipid panel    No orders of the defined types were placed in this encounter.      Follow-up instructions: Return in about 1 year (around 08/09/2021) for [place] (call week prior to visit for lab orders).                                         BP 110/76 (BP Location: Left Arm, Patient Position: Sitting, Cuff Size: Normal)   Pulse 66   Temp 98.1 F (36.7 C) (Oral)   Wt 168 lb (76.2 kg)   LMP 04/28/2015   BMI 27.12 kg/m  Constitutional:  . VSS, see nurse notes . General Appearance: alert, well-developed, well-nourished, NAD Eyes: Marland Kitchen Normal lids and conjunctive, non-icteric sclera Neck: . No masses, trachea midline . No thyroid enlargement/tenderness/mass appreciated Respiratory: . Normal respiratory effort  . Breath sounds normal, no wheeze/rhonchi/rales Cardiovascular: . S1/S2 normal, no murmur/rub/gallop auscultated Gastrointestinal: . Nontender, no masses . No hepatomegaly, no splenomegaly . No hernia appreciated Musculoskeletal:  . Gait  normal . No clubbing/cyanosis of digits Neurological: . No cranial nerve deficit on limited exam . Motor and sensation intact and symmetric Psychiatric: . Normal judgment/insight . Normal mood and affect  Current Meds  Medication Sig  . ALPRAZolam (XANAX) 0.5 MG tablet 1 tablet daily prn for anxiety  . Multiple Vitamin (MULTIVITAMIN) tablet Take 1 tablet by mouth daily.  . pantoprazole (PROTONIX) 40 MG tablet Take 1 tablet (40 mg total) by mouth daily.   . timolol (BETIMOL) 0.25 % ophthalmic solution 1-2 drops 2 (two) times daily.  . TRAVATAN Z 0.004 % SOLN ophthalmic solution PLACE 1 DROP INTO THE LEFT EYE NIGHTLY    No results found for this or any previous visit (from the past 72 hour(s)).  No results found.     All questions at time of visit were answered - patient instructed to contact office with any additional concerns or updates.  ER/RTC precautions were reviewed with the patient as applicable.   Please note: voice recognition software was used to produce this document, and typos may escape review. Please contact Dr. Sheppard Coil for any needed clarifications.

## 2020-08-11 LAB — CBC
HCT: 42 % (ref 35.0–45.0)
Hemoglobin: 13.9 g/dL (ref 11.7–15.5)
MCH: 29.1 pg (ref 27.0–33.0)
MCHC: 33.1 g/dL (ref 32.0–36.0)
MCV: 87.9 fL (ref 80.0–100.0)
MPV: 10.7 fL (ref 7.5–12.5)
Platelets: 232 10*3/uL (ref 140–400)
RBC: 4.78 10*6/uL (ref 3.80–5.10)
RDW: 13.7 % (ref 11.0–15.0)
WBC: 16.9 10*3/uL — ABNORMAL HIGH (ref 3.8–10.8)

## 2020-08-11 LAB — COMPLETE METABOLIC PANEL WITH GFR
AG Ratio: 1.7 (calc) (ref 1.0–2.5)
ALT: 50 U/L — ABNORMAL HIGH (ref 6–29)
AST: 28 U/L (ref 10–35)
Albumin: 4.3 g/dL (ref 3.6–5.1)
Alkaline phosphatase (APISO): 109 U/L (ref 37–153)
BUN: 19 mg/dL (ref 7–25)
CO2: 21 mmol/L (ref 20–32)
Calcium: 8.9 mg/dL (ref 8.6–10.4)
Chloride: 107 mmol/L (ref 98–110)
Creat: 0.79 mg/dL (ref 0.50–1.05)
GFR, Est African American: 95 mL/min/{1.73_m2} (ref >=60)
GFR, Est Non African American: 82 mL/min/{1.73_m2} (ref >=60)
Globulin: 2.6 g/dL (calc) (ref 1.9–3.7)
Glucose, Bld: 118 mg/dL — ABNORMAL HIGH (ref 65–99)
Potassium: 4.7 mmol/L (ref 3.5–5.3)
Sodium: 141 mmol/L (ref 135–146)
Total Bilirubin: 0.3 mg/dL (ref 0.2–1.2)
Total Protein: 6.9 g/dL (ref 6.1–8.1)

## 2020-08-11 LAB — LIPID PANEL
Cholesterol: 229 mg/dL — ABNORMAL HIGH (ref <200)
HDL: 68 mg/dL (ref >=50)
LDL Cholesterol (Calc): 142 mg/dL (calc) — ABNORMAL HIGH
Non-HDL Cholesterol (Calc): 161 mg/dL (calc) — ABNORMAL HIGH (ref <130)
Total CHOL/HDL Ratio: 3.4 (calc) (ref <5.0)
Triglycerides: 84 mg/dL (ref <150)

## 2020-08-20 ENCOUNTER — Other Ambulatory Visit: Payer: Self-pay

## 2020-08-20 ENCOUNTER — Ambulatory Visit (INDEPENDENT_AMBULATORY_CARE_PROVIDER_SITE_OTHER): Payer: Commercial Managed Care - PPO

## 2020-08-20 ENCOUNTER — Ambulatory Visit: Payer: Commercial Managed Care - PPO | Admitting: Medical-Surgical

## 2020-08-20 ENCOUNTER — Encounter: Payer: Self-pay | Admitting: Medical-Surgical

## 2020-08-20 VITALS — BP 136/93 | HR 84 | Temp 98.6°F | Ht 66.0 in | Wt 167.0 lb

## 2020-08-20 DIAGNOSIS — W19XXXA Unspecified fall, initial encounter: Secondary | ICD-10-CM

## 2020-08-20 DIAGNOSIS — M898X1 Other specified disorders of bone, shoulder: Secondary | ICD-10-CM

## 2020-08-20 DIAGNOSIS — M25511 Pain in right shoulder: Secondary | ICD-10-CM | POA: Diagnosis not present

## 2020-08-20 DIAGNOSIS — Y92009 Unspecified place in unspecified non-institutional (private) residence as the place of occurrence of the external cause: Secondary | ICD-10-CM

## 2020-08-20 MED ORDER — CYCLOBENZAPRINE HCL 10 MG PO TABS: 10.0000 mg | ORAL_TABLET | Freq: Three times a day (TID) | ORAL | 0 refills | Status: DC | PRN

## 2020-08-20 NOTE — Progress Notes (Signed)
Subjective:    CC: fall at home, shoulder/back pain  HPI: Pleasant 60 year old female presenting today for evaluation after a fall at home that ended up with her landing on her upper back on the right shoulder/shoulder blade. She did hit her head at the time but did not lose consciousness. Had a goose egg on the right posterior occiput but this is better and is no longer sore. She was taking Ibuprofen/tylenol as needed. Had started taking Flexeril at bedtime from an old prescription. This was working well for her but this morning, she moved a certain way and had a severe spasm in the thoracic spine and shoulder on the right side. Thought it would be a good idea to get checked out to make sure there was no serious injury. Denies chest pain, shortness of breath, headache, numbness/tingling/weakness of the right arm, and neurological changes.   I reviewed the past medical history, family history, social history, surgical history, and allergies today and no changes were needed.  Please see the problem list section below in epic for further details.  Past Medical History: Past Medical History:  Diagnosis Date  . Anxiety   . CLL (chronic lymphocytic leukemia) ([place])   . Cluster headaches   . Depression   . Other specified glaucoma   . Pain around left eye     several bouts  sicne 2011.    Past Surgical History: Past Surgical History:  Procedure Laterality Date  . [place] SURGERY     2001  . REDUCTION MAMMAPLASTY     Social History: Social History   Socioeconomic History  . Marital status: Divorced    Spouse name: Not on file  . Number of children: 3  . Years of education: 23  . Highest education level: Not on file  Occupational History  . Occupation: Company secretary: [employer]  Tobacco Use  . Smoking status: Former Smoker    Types: Cigarettes  . Smokeless tobacco: Never Used  . Tobacco comment: quit 9 months ago  Vaping Use  . Vaping Use:  Never used  Substance and Sexual Activity  . Alcohol use: Yes    Alcohol/week: 2.0 standard drinks    Types: 2 Standard drinks or equivalent per week  . Drug use: No  . Sexual activity: Yes    Partners: Male    Birth control/protection: Post-menopausal  Other Topics Concern  . Not on file  Social History Narrative   Patient is a single mother. Patient works for [employer]. Patient has college education.   Caffeine- three cups coffee daily.   Left handed.   Social Determinants of Health   Financial Resource Strain: Not on file  Food Insecurity: Not on file  Transportation Needs: Not on file  Physical Activity: Not on file  Stress: Not on file  Social Connections: Not on file   Family History: Family History  Problem Relation Age of Onset  . Diabetes Father   . Stroke Mother   . Heart attack Mother   . Stomach cancer Maternal Aunt   . Down syndrome Sister   . Colon cancer Neg Hx    Allergies: No Known Allergies Medications: See med rec.  Review of Systems: See HPI for pertinent positives and negatives.   Objective:    General: Well Developed, well nourished, and in no acute distress.  Neuro: Alert and oriented x3.  HEENT: Normocephalic, atraumatic.  Skin: Warm and dry. Cardiac: Regular rate and rhythm,  no murmurs rubs or gallops, no lower extremity edema.  Respiratory: Clear to auscultation bilaterally. Not using accessory muscles, speaking in full sentences. MSK: Full ROM to BUE, equal strength with intact sensation. Tenderness to palpation of the thoracic paraspinal muscles, right scapula, and musculature along the lateral right scapula to the right midaxillary line.   Impression and Recommendations:    1. Fall in home, initial encounter/right scapular pain Getting x-rays today. Continue Ibuprofen/Tylenol. Consider heat or ice, depending on what feels best. Sending a new prescription for Flexeril since hers was expired. Discussed topical pain  management options including Voltaren gel, Lidocaine patches, or capsaicin cream.  - DG Shoulder Right; Future  Return if symptoms worsen or fail to improve. ___________________________________________ Clearnce Sorrel, DNP, APRN, FNP-BC [facility] [place]

## 2020-09-28 ENCOUNTER — Ambulatory Visit: Payer: Commercial Managed Care - PPO | Admitting: Sports Medicine

## 2020-09-28 ENCOUNTER — Other Ambulatory Visit: Payer: Self-pay | Admitting: Osteopathic Medicine

## 2020-09-28 ENCOUNTER — Other Ambulatory Visit: Payer: Self-pay

## 2020-09-28 ENCOUNTER — Ambulatory Visit (INDEPENDENT_AMBULATORY_CARE_PROVIDER_SITE_OTHER): Payer: Commercial Managed Care - PPO

## 2020-09-28 DIAGNOSIS — M542 Cervicalgia: Secondary | ICD-10-CM

## 2020-09-28 DIAGNOSIS — M5030 Other cervical disc degeneration, unspecified cervical region: Secondary | ICD-10-CM

## 2020-09-28 DIAGNOSIS — M549 Dorsalgia, unspecified: Secondary | ICD-10-CM

## 2020-09-28 DIAGNOSIS — S22030A Wedge compression fracture of third thoracic vertebra, initial encounter for closed fracture: Secondary | ICD-10-CM

## 2020-09-28 DIAGNOSIS — S299XXA Unspecified injury of thorax, initial encounter: Secondary | ICD-10-CM | POA: Diagnosis not present

## 2020-09-28 DIAGNOSIS — M25511 Pain in right shoulder: Secondary | ICD-10-CM

## 2020-09-28 DIAGNOSIS — M4850XA Collapsed vertebra, not elsewhere classified, site unspecified, initial encounter for fracture: Secondary | ICD-10-CM | POA: Insufficient documentation

## 2020-09-28 MED ORDER — PREDNISONE 50 MG PO TABS: ORAL_TABLET | ORAL | 0 refills | Status: DC

## 2020-09-28 NOTE — Assessment & Plan Note (Addendum)
This is a pleasant 60 year old female, she had a fall about 7 weeks ago, she unfortunately developed severe pain in her right periscapular region, worse with deep breathing. X-rays were unremarkable, due to failure of conservative treatment for greater than 6 weeks.  We will proceed with CT of the chest to ensure were not missing some rib fractures.  Chest CT reviewed, she does have T3 and T4 vertebral compression fractures, she will hold off on prednisone and I am happy to give her some pain medicines if needed.

## 2020-09-28 NOTE — Progress Notes (Addendum)
    Procedures performed today:    None.  Independent interpretation of notes and tests performed by another provider:   Chest CT personally reviewed, T3 and T4 endplate compression fractures, very little height loss.  Brief History, Exam, Impression, and Recommendations:    Vertebral compression fracture [facility] [place]) This is a pleasant 60 year old female, she had a fall about 7 weeks ago, she unfortunately developed severe pain in her right periscapular region, worse with deep breathing. X-rays were unremarkable, due to failure of conservative treatment for greater than 6 weeks.  We will proceed with CT of the chest to ensure were not missing some rib fractures.  Chest CT reviewed, she does have T3 and T4 vertebral compression fractures, she will hold off on prednisone and I am happy to give her some pain medicines if needed.  DDD (degenerative disc disease), cervical History of C6-C7 ACDF, she is having right-sided periscapular symptoms after her fall. We do need x-rays of her neck, adding 5 days of prednisone, a few sessions of therapy. Return to see me in about 4 weeks.  Compression fractures noted, hold off on prednisone for now.    ___________________________________________ Gwen Her. Dianah Field, M.D., ABFM., CAQSM. Primary Care and [place] Instructor of [place] of [facility]

## 2020-09-28 NOTE — Assessment & Plan Note (Addendum)
History of C6-C7 ACDF, she is having right-sided periscapular symptoms after her fall. We do need x-rays of her neck, adding 5 days of prednisone, a few sessions of therapy. Return to see me in about 4 weeks.  Compression fractures noted, hold off on prednisone for now.

## 2020-10-10 ENCOUNTER — Encounter: Payer: Self-pay | Admitting: Rehabilitative and Restorative Service Providers"

## 2020-10-10 ENCOUNTER — Ambulatory Visit (INDEPENDENT_AMBULATORY_CARE_PROVIDER_SITE_OTHER): Payer: Commercial Managed Care - PPO | Admitting: Rehabilitative and Restorative Service Providers"

## 2020-10-10 ENCOUNTER — Other Ambulatory Visit: Payer: Self-pay

## 2020-10-10 DIAGNOSIS — R29898 Other symptoms and signs involving the musculoskeletal system: Secondary | ICD-10-CM

## 2020-10-10 DIAGNOSIS — R293 Abnormal posture: Secondary | ICD-10-CM

## 2020-10-10 DIAGNOSIS — M546 Pain in thoracic spine: Secondary | ICD-10-CM

## 2020-10-10 DIAGNOSIS — M542 Cervicalgia: Secondary | ICD-10-CM
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
## 2020-10-10 NOTE — Patient Instructions (Addendum)
Myofacial ball release work with ~ 4 inch plastic ball   TENS UNIT: This is helpful for muscle pain and spasm.   Search and Purchase a TENS 7000 2nd edition at www.tenspros.com. It should be less than $30.     TENS unit instructions: Do not shower or bathe with the unit on Turn the unit off before removing electrodes or batteries If the electrodes lose stickiness add a drop of water to the electrodes after they are disconnected from the unit and place on plastic sheet. If you continued to have difficulty, call the TENS unit company to purchase more electrodes. Do not apply lotion on the skin area prior to use. Make sure the skin is clean and dry as this will help prolong the life of the electrodes. After use, always check skin for unusual red areas, rash or other skin difficulties. If there are any skin problems, does not apply electrodes to the same area. Never remove the electrodes from the unit by pulling the wires. Do not use the TENS unit or electrodes other than as directed. Do not change electrode placement without consultating your therapist or physician. Keep 2 fingers with between each electrode.   Access Code: [email or web address]: [email or web address]: 03/16/2022Prepared by: Claudene Gatliff HoltExercises  Doorway Pec Stretch at 60 Degrees Abduction - 3 x daily - 7 x weekly - 3 reps - 1 sets  Doorway Pec Stretch at 90 Degrees Abduction - 3 x daily - 7 x weekly - 3 reps - 1 sets - 30 seconds hold  Doorway Pec Stretch at 120 Degrees Abduction - 3 x daily - 7 x weekly - 3 reps - 1 sets - 30 second hold hold  Seated Scapular Retraction - 2 x daily - 7 x weekly - 1-2 sets - 10 reps - 10 sec hold  Seated Cervical Retraction - 2 x daily - 7 x weekly - 1-2 sets - 5-10 reps - 10 sec hold

## 2020-10-17 ENCOUNTER — Other Ambulatory Visit: Payer: Self-pay

## 2020-10-17 ENCOUNTER — Encounter: Payer: Self-pay | Admitting: Rehabilitative and Restorative Service Providers"

## 2020-10-17 ENCOUNTER — Ambulatory Visit (INDEPENDENT_AMBULATORY_CARE_PROVIDER_SITE_OTHER): Payer: Commercial Managed Care - PPO | Admitting: Rehabilitative and Restorative Service Providers"

## 2020-10-17 DIAGNOSIS — M542 Cervicalgia: Secondary | ICD-10-CM

## 2020-10-17 DIAGNOSIS — R29898 Other symptoms and signs involving the musculoskeletal system: Secondary | ICD-10-CM

## 2020-10-17 DIAGNOSIS — M546 Pain in thoracic spine: Secondary | ICD-10-CM | POA: Diagnosis not present

## 2020-10-17 DIAGNOSIS — R293 Abnormal posture: Secondary | ICD-10-CM | POA: Diagnosis not present

## 2020-10-17 NOTE — Therapy (Addendum)
[place], [place], [address] Phone: [phone]   Fax:  [phone]  Physical Therapy Treatment  Patient Details  Name: Danielle Kim MRN: [id number] Date of Birth: March 08, 1961 Referring Provider (PT): Dr Cresenciano Lick   Encounter Date: 10/17/2020   PT End of Session - 10/17/20 1351    Visit Number 2    Number of Visits 8    Date for PT Re-Evaluation 12/05/20    PT Start Time 7893    PT Stop Time 1435    PT Time Calculation (min) 50 min    Activity Tolerance Patient tolerated treatment well           Past Medical History:  Diagnosis Date  . Anxiety   . CLL (chronic lymphocytic leukemia) ([place])   . Cluster headaches   . Depression   . Other specified glaucoma   . Pain around left eye     several bouts  sicne 2011.     Past Surgical History:  Procedure Laterality Date  . [place] SURGERY     2001  . REDUCTION MAMMAPLASTY      There were no vitals filed for this visit.   Subjective Assessment - 10/17/20 1352    Subjective Patient reports that she has had continued pain in the midback. She has done her exercises one time a daily. Thinks she was overdoing the exercises and decreased to one time per week. She did purchase a TENS unit which has helped.    Currently in Pain? Yes    Pain Score 2     Pain Location Thoracic    Pain Orientation Right;Posterior;Mid    Pain Descriptors / Indicators Aching;Burning    Pain Type Acute pain    Pain Onset More than a month ago    Pain Frequency Intermittent                             [facility] Adult PT Treatment/Exercise - 10/17/20 0001      Shoulder Exercises: Standing   Extension Strengthening;Both;10 reps;Theraband    Theraband Level (Shoulder Extension) Level 2 (Red)    Row Strengthening;Both;10 reps;Theraband    Theraband Level (Shoulder Row) Level 2 (Red)    Retraction Strengthening;Both;10 reps;Theraband     Theraband Level (Shoulder Retraction) Level 1 (Yellow)    Other Standing Exercises chin tuck 10 sec x 5; scap squeeze 10 sec x 5    Other Standing Exercises posterior shoulder rolls x 10      Shoulder Exercises: Stretch   Other Shoulder Stretches gentle doorway stretch 2 lower positions ~ 5 -10 sec hold x 2 reps      Manual Therapy   Manual therapy comments pt prone    Soft tissue mobilization STM through Rt > Lt posterior lateral cervical and thoracic musculature - minimal pressure    Myofascial Release Rt thoracic    Scapular Mobilization gentle - multiple planes      Neck Exercises: Stretches   Upper Trapezius Stretch Right;Left;3 reps;10 seconds    Upper Trapezius Stretch Limitations to pt tolerance                  PT Education - 10/17/20 1407    Education Details HEP DN    Person(s) Educated Patient    Methods Explanation;Demonstration;Tactile cues;Verbal cues;Handout    Comprehension Verbalized understanding;Returned demonstration;Verbal cues required;Tactile cues required  PT Long Term Goals - 10/10/20 1447      PT LONG TERM GOAL #1   Title Improve posture and alignment with patient to demonstrate improved upright posture with posterior shoulder girdle engaged    Time 8    Period Weeks    Status New    Target Date 12/05/20      PT LONG TERM GOAL #2   Title Increase AROM cervical lateral flexion and rotation by 5-8 degrees    Time 8    Period Weeks    Status New    Target Date 12/05/20      PT LONG TERM GOAL #3   Title Decreased pain in the thoracic and cervical spine with patient reporting ability to use UE's for functional activities    Time 8    Period Weeks    Status New    Target Date 12/05/20      PT LONG TERM GOAL #4   Title Independent in HEP    Time 8    Period Weeks    Status New    Target Date 12/05/20      PT LONG TERM GOAL #5   Title Improve functional limitations score to 57    Time 8    Period Weeks    Status  New    Target Date 12/05/20                 Plan - 10/17/20 1400    Clinical Impression Statement Patient reports some incresaed soreness from the exercises and has decresaed frwquency to 1x/wk. Reviewed/corrected exercises and reps. Added stretching and posterior shoulder girdle strengthening. Trial of manual work through the Payne thoracic musculature and upper trap/leveator. Patient very apprehensive re- exercise and manual work due to history of cervical fusion and continued tightness through the cervical area for years. Worked to adjust exercise and pressure for manual work.    Rehab Potential Good    PT Frequency 1x / week    PT Duration 6 weeks    PT Treatment/Interventions ADLs/Self Care Home Management;Aquatic Therapy;Cryotherapy;Electrical Stimulation;Iontophoresis 37m/ml Dexamethasone;Moist Heat;Ultrasound;Functional mobility training;Therapeutic activities;Therapeutic exercise;Balance training;Neuromuscular re-education;Patient/family education;Manual techniques;Dry needling;Taping    PT Next Visit Plan review HEP; progress with gentel postural exercises/activities; assess response to manual work - consider trial of DN cervical and Rt periscapular musculature - provided patient with information on DN - pt using TENS unit at home    PT HHarmonyand Agree with Plan of Care Patient           Patient will benefit from skilled therapeutic intervention in order to improve the following deficits and impairments:     Visit Diagnosis: Pain in thoracic spine  Cervicalgia  Other symptoms and signs involving the musculoskeletal system  Abnormal posture     Problem List Patient Active Problem List   Diagnosis Date Noted  . Vertebral compression fracture ([place] 09/28/2020  . Numbness around mouth 09/23/2018  . DDD (degenerative disc disease), cervical 08/23/2018  . Primary osteoarthritis of first carpometacarpal joint of one hand, right  08/23/2018  . [place]ocytic [place]) [place] 09/02/2017  . Trigger middle finger of right hand 11/07/2015  . Insomnia 03/31/2014  . Pain around left eye   . Secondary open-angle glaucoma of left eye, severe stage 02/07/2013    Gerhart Ruggieri PNilda SimmerPT, MPH  10/17/2020, 2:50 PM  [facility] [address]  Phone: [phone]   Fax:  [phone]  Name: Tristina Sahagian Sherrow MRN: [id number] Date of Birth: 08/13/1960  PHYSICAL THERAPY DISCHARGE SUMMARY  Visits from Start of Care: 2  Current functional level related to goals / functional outcomes: See progress note for discharge status.    Remaining deficits: Unknown    Education / Equipment: HEP  Plan: Patient agrees to discharge.  Patient goals were not met. Patient is being discharged due to not returning since the last visit.  ?????    Jawuan Robb P. Helene Kelp PT, MPH 11/30/20 1:29 PM

## 2020-10-17 NOTE — Patient Instructions (Addendum)
Access Code: [email or web address]: [email or web address]: 03/23/2022Prepared by: Camia Dipinto HoltExercises  Doorway Pec Stretch at 60 Degrees Abduction - 3 x daily - 7 x weekly - 3 reps - 1 sets  Doorway Pec Stretch at 90 Degrees Abduction - 3 x daily - 7 x weekly - 3 reps - 1 sets - 30 seconds hold  Doorway Pec Stretch at 120 Degrees Abduction - 3 x daily - 7 x weekly - 3 reps - 1 sets - 30 second hold hold  Seated Scapular Retraction - 2 x daily - 7 x weekly - 1-2 sets - 10 reps - 10 sec hold  Seated Cervical Retraction - 2 x daily - 7 x weekly - 1-2 sets - 5-10 reps - 10 sec hold  Standing Shoulder External Rotation with Resistance - 2 x daily - 7 x weekly - 1-3 sets - 10 reps - 2-3 sec hold  Standing Bilateral Low Shoulder Row with Anchored Resistance - 2 x daily - 7 x weekly - 1-3 sets - 10 reps - 2-3 sec hold  Shoulder Extension with Resistance - 2 x daily - 7 x weekly - 1-3 sets - 10 reps - 2-3 sec hold  Standing Backward Shoulder Rolls - 2 x daily - 7 x weekly - 1 sets - 10 reps - 1-2 sec hold  Seated Cervical Sidebending AROM - 2 x daily - 7 x weekly - 1 sets - 5 reps - 5-10 sec hold   Trigger Point Dry Needling  . What is Trigger Point Dry Needling (DN)? o DN is a physical therapy technique used to treat muscle pain and dysfunction. Specifically, DN helps deactivate muscle trigger points (muscle knots).  o A thin filiform needle is used to penetrate the skin and stimulate the underlying trigger point. The goal is for a local twitch response (LTR) to occur and for the trigger point to relax. No medication of any kind is injected during the procedure.   . What Does Trigger Point Dry Needling Feel Like?  o The procedure feels different for each individual patient. Some patients report that they do not actually feel the needle enter the skin and overall the process is not painful. Very mild bleeding may occur. However, many patients feel a deep cramping in the muscle in which the needle  was inserted. This is the local twitch response.   Marland Kitchen How Will I feel after the treatment? o Soreness is normal, and the onset of soreness may not occur for a few hours. Typically this soreness does not last longer than two days.  o Bruising is uncommon, however; ice can be used to decrease any possible bruising.  o In rare cases feeling tired or nauseous after the treatment is normal. In addition, your symptoms may get worse before they get better, this period will typically not last longer than 24 hours.   . What Can I do After My Treatment? o Increase your hydration by drinking more water for the next 24 hours. o You may place ice or heat on the areas treated that have become sore, however, do not use heat on inflamed or bruised areas. Heat often brings more relief post needling. o You can continue your regular activities, but vigorous activity is not recommended initially after the treatment for 24 hours. o DN is best combined with other physical therapy such as strengthening, stretching, and other therapies.

## 2020-10-24 ENCOUNTER — Encounter: Payer: Commercial Managed Care - PPO | Admitting: Rehabilitative and Restorative Service Providers"

## 2020-10-31 ENCOUNTER — Other Ambulatory Visit: Payer: Self-pay

## 2020-10-31 ENCOUNTER — Ambulatory Visit: Payer: Commercial Managed Care - PPO | Admitting: Sports Medicine

## 2020-10-31 ENCOUNTER — Ambulatory Visit (INDEPENDENT_AMBULATORY_CARE_PROVIDER_SITE_OTHER): Payer: Commercial Managed Care - PPO

## 2020-10-31 DIAGNOSIS — S22030D Wedge compression fracture of third thoracic vertebra, subsequent encounter for fracture with routine healing: Secondary | ICD-10-CM

## 2020-10-31 MED ORDER — PREDNISONE 50 MG PO TABS: ORAL_TABLET | ORAL | 0 refills | Status: DC

## 2020-10-31 NOTE — Assessment & Plan Note (Signed)
This is a very pleasant 60 year old female, she had a fall approximate 11 weeks ago and developed severe pain in the right periscapular region with deep breathing, x-rays were unremarkable, we added the CT scan that confirms T3 and T4 vertebral compression fractures. Repeating x-rays today, pain up higher in the thoracic spine between the shoulder blades has improved at least 60%. She is having some pain a bit further down around the T7-T9 region, we will get some updated thoracic spine x-rays. I am going to add prednisone which she will hold off on using until we determine there are no new vertebral compression fractures. Follow-up in 4 weeks for this.

## 2020-10-31 NOTE — Progress Notes (Signed)
    Procedures performed today:    None.  Independent interpretation of notes and tests performed by another provider:   None.  Brief History, Exam, Impression, and Recommendations:    Vertebral compression fracture [facility]) This is a very pleasant 60 year old female, she had a fall approximate 11 weeks ago and developed severe pain in the right periscapular region with deep breathing, x-rays were unremarkable, we added the CT scan that confirms T3 and T4 vertebral compression fractures. Repeating x-rays today, pain up higher in the thoracic spine between the shoulder blades has improved at least 60%. She is having some pain a bit further down around the T7-T9 region, we will get some updated thoracic spine x-rays. I am going to add prednisone which she will hold off on using until we determine there are no new vertebral compression fractures. Follow-up in 4 weeks for this.    ___________________________________________ Gwen Her. Dianah Field, M.D., ABFM., CAQSM. Primary Care and [place] Instructor of [place] of [facility] of Medicine

## 2020-12-13 ENCOUNTER — Ambulatory Visit: Payer: Commercial Managed Care - PPO

## 2021-01-29 ENCOUNTER — Telehealth: Payer: Self-pay | Admitting: Family Medicine

## 2021-01-29 ENCOUNTER — Telehealth (INDEPENDENT_AMBULATORY_CARE_PROVIDER_SITE_OTHER): Payer: Commercial Managed Care - PPO | Admitting: Family Medicine

## 2021-01-29 ENCOUNTER — Other Ambulatory Visit: Payer: Self-pay

## 2021-01-29 VITALS — Temp 100.9°F

## 2021-01-29 DIAGNOSIS — U071 COVID-19: Secondary | ICD-10-CM | POA: Diagnosis not present

## 2021-01-29 NOTE — Progress Notes (Signed)
Wasn't feeling well. She took Covid test this morning that was positive.   She took ES tylenol around 12 pm today. She has been taking the emergen-C drink packs. She c/o body aches mostly.

## 2021-01-29 NOTE — Progress Notes (Signed)
Virtual Visit via Telephone Note  I connected with Danielle Kim on 01/29/21 at  4:20 PM EDT by telephone and verified that I am speaking with the correct person using two identifiers.   I discussed the limitations, risks, security and privacy concerns of performing an evaluation and management service by telephone and the availability of in person appointments. I also discussed with the patient that there may be a patient responsible charge related to this service. The patient expressed understanding and agreed to proceed.  Patient location: at home  Provider loccation: In office   Subjective:    CC:  COVID -19   HPI: Woke up at 1:30 AM 2 days ago with ST. Then when woke up yesterday didn't feel well. Felt achy.  Temp 100.9. Then called [facility] around 6 AM this AM and rec test for COVID-19.    Past medical history, Surgical history, Family history not pertinant except as noted below, Social history, Allergies, and medications have been entered into the medical record, reviewed, and corrections made.   Review of Systems: No fevers, chills, night sweats, weight loss, chest pain, or shortness of breath.   Objective:    General: Speaking clearly in complete sentences without any shortness of breath.  Alert and oriented x3.  Normal judgment. No apparent acute distress.    Impression and Recommendations:    COVID-19 - discussed options. She is low risk overall.  Will call back if feels worse over night and can rx Paxlovid.  We did discuss that if she starts that it has to be take within the first 5 days.  In the meantime recommend symptomatic care recommend ibuprofen or Tylenol as needed for the fever she is frustrated that she is unable to work but did discuss with her that she is probably can feel bad enough for couple days that she is not going to be able to work but after that should be able to work from home.      I discussed the assessment and treatment plan with the patient. The  patient was provided an opportunity to ask questions and all were answered. The patient agreed with the plan and demonstrated an understanding of the instructions.   The patient was advised to call back or seek an in-person evaluation if the symptoms worsen or if the condition fails to improve as anticipated.  I provided 15 minutes of non-face-to-face time during this encounter.   Beatrice Lecher, MD

## 2021-01-30 NOTE — Telephone Encounter (Signed)
error 

## 2021-03-12 ENCOUNTER — Encounter (HOSPITAL_BASED_OUTPATIENT_CLINIC_OR_DEPARTMENT_OTHER): Payer: Self-pay

## 2021-04-03 ENCOUNTER — Ambulatory Visit (HOSPITAL_BASED_OUTPATIENT_CLINIC_OR_DEPARTMENT_OTHER)
Admission: RE | Admit: 2021-04-03 | Discharge: 2021-04-03 | Disposition: A | Discharge status: Home / Self Care | Payer: Commercial Managed Care - PPO | Source: Ambulatory Visit | Attending: Obstetrics & Gynecology | Admitting: Obstetrics & Gynecology

## 2021-04-03 ENCOUNTER — Ambulatory Visit (INDEPENDENT_AMBULATORY_CARE_PROVIDER_SITE_OTHER): Payer: Commercial Managed Care - PPO | Admitting: Obstetrics & Gynecology

## 2021-04-03 ENCOUNTER — Encounter (HOSPITAL_BASED_OUTPATIENT_CLINIC_OR_DEPARTMENT_OTHER): Payer: Self-pay | Admitting: Obstetrics & Gynecology

## 2021-04-03 ENCOUNTER — Other Ambulatory Visit: Payer: Self-pay

## 2021-04-03 VITALS — BP 133/89 | HR 61 | Ht 66.0 in | Wt 170.0 lb

## 2021-04-03 DIAGNOSIS — Z01419 Encounter for gynecological examination (general) (routine) without abnormal findings: Secondary | ICD-10-CM | POA: Diagnosis not present

## 2021-04-03 DIAGNOSIS — Z780 Asymptomatic menopausal state: Secondary | ICD-10-CM

## 2021-04-03 DIAGNOSIS — A6004 Herpesviral vulvovaginitis: Secondary | ICD-10-CM

## 2021-04-03 DIAGNOSIS — F329 Major depressive disorder, single episode, unspecified: Secondary | ICD-10-CM | POA: Diagnosis not present

## 2021-04-03 DIAGNOSIS — S22030D Wedge compression fracture of third thoracic vertebra, subsequent encounter for fracture with routine healing: Secondary | ICD-10-CM

## 2021-04-03 DIAGNOSIS — Z1231 Encounter for screening mammogram for malignant neoplasm of breast: Secondary | ICD-10-CM | POA: Diagnosis present

## 2021-04-03 DIAGNOSIS — C9110 Chronic lymphocytic leukemia of B-cell type not having achieved remission: Secondary | ICD-10-CM | POA: Diagnosis not present

## 2021-04-03 MED ORDER — SERTRALINE HCL 50 MG PO TABS: 75.0000 mg | ORAL_TABLET | Freq: Every day | ORAL | 3 refills | Status: DC

## 2021-04-03 MED ORDER — VALACYCLOVIR HCL 500 MG PO TABS: 500.0000 mg | ORAL_TABLET | Freq: Every day | ORAL | 4 refills | Status: DC

## 2021-04-03 NOTE — Progress Notes (Signed)
60 y.o. G3P3 Divorced White or Caucasian female here for annual exam.  Still struggling some after death of son.  Feels she may never be happy again. Had some medical issues earlier this year.    Pt reports she had a root canal in January.  She went out to get her mail and slipped.  She ended up having a fracture of T3 and T4.  Reports she had to be out of work for recuperation.  She had been granted an FMLA for two weeks due to issues with grief/death of son and used this for recovery.  Advised pt she should have requested FMLA from provider helping to manage her back issues.  Was on wellbutrin but did not do well with this.  Back on zoloft '50mg'$ .  Dr. Sheppard Coil did this prescription.  Needs refill.  Is still having some feelings of depression.  Discussing increasing dosage.  Will change to '75mg'$ .  Needs Valtrex rx.  Was taking for suppression.    Patient's last menstrual period was 04/28/2015.          Sexually active: No.  The current method of family planning is post menopausal status.    Exercising: Yes.     Smoker:  no  Health Maintenance: Pap:  09/13/2019 Negative History of abnormal Pap:  remote hx MMG:  06/30/2019 Negative Colonoscopy:  09/14/2013 BMD:   hasn't been done yet TDaP:  2020 Pneumonia vaccine(s):  completed Shingrix:   completed Hep C testing: 2018 Screening Labs: 07/2020   reports that she has quit smoking. Her smoking use included cigarettes. She has never used smokeless tobacco. She reports current alcohol use of about 2.0 standard drinks per week. She reports that she does not use drugs.  Past Medical History:  Diagnosis Date   Anxiety    CLL (chronic lymphocytic leukemia) (HCC)    Cluster headaches    Depression    Other specified glaucoma    Pain around left eye     several bouts  sicne 2011.     Past Surgical History:  Procedure Laterality Date   CERVICAL DISC SURGERY     2001   REDUCTION MAMMAPLASTY      Current Outpatient Medications   Medication Sig Dispense Refill   ALPRAZolam (XANAX) 0.5 MG tablet 1 tablet daily prn for anxiety 20 tablet 0   Multiple Vitamin (MULTIVITAMIN) tablet Take 1 tablet by mouth daily.     pantoprazole (PROTONIX) 40 MG tablet TAKE 1 TABLET BY MOUTH EVERY DAY 90 tablet 1   sertraline (ZOLOFT) 50 MG tablet Take 50 mg by mouth daily.     timolol (BETIMOL) 0.25 % ophthalmic solution 1-2 drops 2 (two) times daily.     TRAVATAN Z 0.004 % SOLN ophthalmic solution PLACE 1 DROP INTO THE LEFT EYE NIGHTLY  5   valACYclovir (VALTREX) 500 MG tablet Take 1 tablet (500 mg total) by mouth 2 (two) times daily as needed. 90 tablet 4   albuterol (VENTOLIN HFA) 108 (90 Base) MCG/ACT inhaler Inhale 1-2 puffs into the lungs every 6 (six) hours as needed for wheezing or shortness of breath. (Patient not taking: Reported on 04/03/2021) 18 g 0   ipratropium (ATROVENT) 0.06 % nasal spray Place into both nostrils. (Patient not taking: Reported on 04/03/2021)     No current facility-administered medications for this visit.    Family History  Problem Relation Age of Onset   Diabetes Father    Stroke Mother    Heart attack Mother  Stomach cancer Maternal Aunt    Down syndrome Sister    Colon cancer Neg Hx     Review of Systems  All other systems reviewed and are negative.  Exam:   BP 133/89 (BP Location: Left Arm, Patient Position: Sitting, Cuff Size: Large)   Pulse 61   Ht '5\' 6"'$  (1.676 m)   Wt 170 lb (77.1 kg)   LMP 04/28/2015   BMI 27.44 kg/m   Height: '5\' 6"'$  (167.6 cm)  General appearance: alert, cooperative and appears stated age Head: Normocephalic, without obvious abnormality, atraumatic Neck: no adenopathy, supple, symmetrical, trachea midline and thyroid normal to inspection and palpation Lungs: clear to auscultation bilaterally Breasts: normal appearance, no masses or tenderness Heart: regular rate and rhythm Abdomen: soft, non-tender; bowel sounds normal; no masses,  no organomegaly Extremities:  extremities normal, atraumatic, no cyanosis or edema Skin: Skin color, texture, turgor normal. No rashes or lesions Lymph nodes: Cervical, supraclavicular, and axillary nodes normal. No abnormal inguinal nodes palpated Neurologic: Grossly normal  Pelvic: External genitalia:  no lesions              Urethra:  normal appearing urethra with no masses, tenderness or lesions              Bartholins and Skenes: normal                 Vagina: normal appearing vagina with normal color and no discharge, no lesions              Cervix: no lesions              Pap taken: No. Bimanual Exam:  Uterus:  normal size, contour, position, consistency, mobility, non-tender              Adnexa: normal adnexa and no mass, fullness, tenderness               Rectovaginal: Confirms               Anus:  normal sphincter tone, no lesions  Chaperone, Octaviano Batty, CMA, was present for exam.  Assessment/Plan: 1. Well woman exam with routine gynecological exam - last pap 08/2019 neg with neg HR HPV.  Not indicated today. - MMG 06/30/2019 - colonoscopy 09/14/2013 - will plan BMD next year - vaccines reviewed/updated - lab work done with Dr. Sheppard Coil earlier this year (reviewed)  2. Postmenopausal - no HRT  3. Reactive depression - will adjust antidepressant - feel continued intermittent FMLA is reasonable  - sertraline (ZOLOFT) 50 MG tablet; Take 1.5 tablets (75 mg total) by mouth daily.  Dispense: 135 tablet; Refill: 3  4. Herpes simplex vulvovaginitis - valACYclovir (VALTREX) 500 MG tablet; Take 1 tablet (500 mg total) by mouth daily.  Dispense: 90 tablet; Refill: 4  5. CLL (chronic lymphocytic leukemia) ([facility]) - CBC 07/2020 in Epic with stable elevated WBC ct  6. Compression fracture of T3 vertebra with routine healing, subsequent encounter

## 2021-04-04 DIAGNOSIS — Z780 Asymptomatic menopausal state: Secondary | ICD-10-CM | POA: Insufficient documentation

## 2021-04-04 DIAGNOSIS — F329 Major depressive disorder, single episode, unspecified: Secondary | ICD-10-CM | POA: Insufficient documentation

## 2021-04-04 DIAGNOSIS — A6004 Herpesviral vulvovaginitis: Secondary | ICD-10-CM | POA: Insufficient documentation

## 2021-04-05 ENCOUNTER — Encounter (HOSPITAL_BASED_OUTPATIENT_CLINIC_OR_DEPARTMENT_OTHER): Payer: Self-pay | Admitting: *Deleted

## 2021-04-05 NOTE — Progress Notes (Signed)
FMLA paperwork scanned in.  Emailed to patient as requested. KW CMA

## 2021-04-09 ENCOUNTER — Other Ambulatory Visit (HOSPITAL_BASED_OUTPATIENT_CLINIC_OR_DEPARTMENT_OTHER): Payer: Self-pay | Admitting: Obstetrics & Gynecology

## 2021-04-09 ENCOUNTER — Other Ambulatory Visit: Payer: Self-pay

## 2021-04-09 ENCOUNTER — Ambulatory Visit (INDEPENDENT_AMBULATORY_CARE_PROVIDER_SITE_OTHER): Payer: Commercial Managed Care - PPO | Admitting: *Deleted

## 2021-04-09 ENCOUNTER — Other Ambulatory Visit (HOSPITAL_COMMUNITY)
Admission: RE | Admit: 2021-04-09 | Discharge: 2021-04-09 | Disposition: A | Discharge status: Home / Self Care | Payer: Commercial Managed Care - PPO | Source: Ambulatory Visit | Attending: Obstetrics & Gynecology | Admitting: Obstetrics & Gynecology

## 2021-04-09 DIAGNOSIS — R350 Frequency of micturition: Secondary | ICD-10-CM | POA: Diagnosis not present

## 2021-04-09 DIAGNOSIS — N898 Other specified noninflammatory disorders of vagina: Secondary | ICD-10-CM | POA: Diagnosis not present

## 2021-04-09 LAB — POCT URINALYSIS DIPSTICK
Bilirubin, UA: NEGATIVE
Glucose, UA: NEGATIVE
Ketones, UA: NEGATIVE
Leukocytes, UA: NEGATIVE
Nitrite, UA: NEGATIVE
Protein, UA: NEGATIVE
Spec Grav, UA: 1.02 (ref 1.010–1.025)
Urobilinogen, UA: 0.2 E.U./dL
pH, UA: 7 (ref 5.0–8.0)

## 2021-04-09 MED ORDER — SULFAMETHOXAZOLE-TRIMETHOPRIM 800-160 MG PO TABS: 1.0000 | ORAL_TABLET | Freq: Two times a day (BID) | ORAL | 0 refills | Status: DC

## 2021-04-09 NOTE — Progress Notes (Signed)
Order for vaginal swab

## 2021-04-10 ENCOUNTER — Telehealth (HOSPITAL_BASED_OUTPATIENT_CLINIC_OR_DEPARTMENT_OTHER): Payer: Self-pay | Admitting: Obstetrics & Gynecology

## 2021-04-10 ENCOUNTER — Ambulatory Visit: Payer: Commercial Managed Care - PPO | Admitting: Family Medicine

## 2021-04-10 LAB — CERVICOVAGINAL ANCILLARY ONLY
Bacterial Vaginitis (gardnerella): NEGATIVE
Candida Glabrata: NEGATIVE
Candida Vaginitis: NEGATIVE
Comment: NEGATIVE
Comment: NEGATIVE
Comment: NEGATIVE

## 2021-04-10 NOTE — Telephone Encounter (Signed)
Patient call and would like for nurse to call and let her know the result.And would like to know if Dr.Miller can go head start her on something.Per the patient she feels like she has the UTI.

## 2021-04-11 ENCOUNTER — Other Ambulatory Visit (HOSPITAL_BASED_OUTPATIENT_CLINIC_OR_DEPARTMENT_OTHER): Payer: Self-pay | Admitting: *Deleted

## 2021-04-11 ENCOUNTER — Encounter (HOSPITAL_BASED_OUTPATIENT_CLINIC_OR_DEPARTMENT_OTHER): Payer: Self-pay

## 2021-04-11 MED ORDER — SULFAMETHOXAZOLE-TRIMETHOPRIM 800-160 MG PO TABS: 1.0000 | ORAL_TABLET | Freq: Two times a day (BID) | ORAL | 0 refills | Status: DC

## 2021-04-11 NOTE — Telephone Encounter (Signed)
DOB verified. Informed pt that Rx had been sent to her pharmacy for UTI treatment on 9/13. Pt states that pharmacy did not have it. Pt requests that it be sent to a pharmacy in [place] where she is currently travelling to.

## 2021-04-11 NOTE — Progress Notes (Signed)
Patient came in today to give a urine sample and to do a self-aptima swab. Patient has a complaint of frequent urination, urgency and a little itching. Patient came in per office recommendations. tbw

## 2021-04-27 ENCOUNTER — Other Ambulatory Visit: Payer: Self-pay | Admitting: Osteopathic Medicine

## 2021-05-17 ENCOUNTER — Telehealth (HOSPITAL_BASED_OUTPATIENT_CLINIC_OR_DEPARTMENT_OTHER): Payer: Self-pay

## 2021-05-17 ENCOUNTER — Other Ambulatory Visit (HOSPITAL_BASED_OUTPATIENT_CLINIC_OR_DEPARTMENT_OTHER): Payer: Self-pay | Admitting: Obstetrics & Gynecology

## 2021-05-17 DIAGNOSIS — C9110 Chronic lymphocytic leukemia of B-cell type not having achieved remission: Secondary | ICD-10-CM

## 2021-05-17 DIAGNOSIS — R7309 Other abnormal glucose: Secondary | ICD-10-CM

## 2021-05-17 DIAGNOSIS — E785 Hyperlipidemia, unspecified: Secondary | ICD-10-CM

## 2021-05-17 DIAGNOSIS — Z0000 Encounter for general adult medical examination without abnormal findings: Secondary | ICD-10-CM

## 2021-05-17 NOTE — Telephone Encounter (Signed)
Patient called today to ask if you would order blood work for to have done. She states that she got so busy talking about other things at her visit that it just slipped her mind to ask you. Patient was seen 04/03/2021. Please advise.

## 2021-05-22 NOTE — Telephone Encounter (Signed)
Patient will be coming in on 05/23/2021 to have labs drawn. tbw

## 2021-05-23 ENCOUNTER — Other Ambulatory Visit (HOSPITAL_BASED_OUTPATIENT_CLINIC_OR_DEPARTMENT_OTHER): Payer: Commercial Managed Care - PPO

## 2021-05-23 ENCOUNTER — Other Ambulatory Visit: Payer: Self-pay

## 2021-05-23 DIAGNOSIS — E785 Hyperlipidemia, unspecified: Secondary | ICD-10-CM

## 2021-05-23 DIAGNOSIS — Z0000 Encounter for general adult medical examination without abnormal findings: Secondary | ICD-10-CM

## 2021-05-23 DIAGNOSIS — R7309 Other abnormal glucose: Secondary | ICD-10-CM

## 2021-05-23 DIAGNOSIS — C9110 Chronic lymphocytic leukemia of B-cell type not having achieved remission: Secondary | ICD-10-CM

## 2021-05-23 NOTE — Progress Notes (Signed)
Pt here for lab work 

## 2021-05-24 LAB — CBC WITH DIFFERENTIAL/PLATELET
Basophils Absolute: 0.1 10*3/uL (ref 0.0–0.2)
Basos: 0 %
EOS (ABSOLUTE): 0.1 10*3/uL (ref 0.0–0.4)
Eos: 1 %
Hematocrit: 42.9 % (ref 34.0–46.6)
Hemoglobin: 14.2 g/dL (ref 11.1–15.9)
Immature Grans (Abs): 0 10*3/uL (ref 0.0–0.1)
Immature Granulocytes: 0 %
Lymphocytes Absolute: 19.3 10*3/uL — ABNORMAL HIGH (ref 0.7–3.1)
Lymphs: 82 %
MCH: 29.2 pg (ref 26.6–33.0)
MCHC: 33.1 g/dL (ref 31.5–35.7)
MCV: 88 fL (ref 79–97)
Monocytes Absolute: 0.7 10*3/uL (ref 0.1–0.9)
Monocytes: 3 %
Neutrophils Absolute: 3.3 10*3/uL (ref 1.4–7.0)
Neutrophils: 14 %
Platelets: 212 10*3/uL (ref 150–450)
RBC: 4.87 x10E6/uL (ref 3.77–5.28)
RDW: 13.4 % (ref 11.7–15.4)
WBC: 23.6 10*3/uL (ref 3.4–10.8)

## 2021-05-24 LAB — LIPID PANEL
Chol/HDL Ratio: 3.4 ratio (ref 0.0–4.4)
Cholesterol, Total: 210 mg/dL — ABNORMAL HIGH (ref 100–199)
HDL: 61 mg/dL (ref >39)
LDL Chol Calc (NIH): 122 mg/dL — ABNORMAL HIGH (ref 0–99)
Triglycerides: 153 mg/dL — ABNORMAL HIGH (ref 0–149)
VLDL Cholesterol Cal: 27 mg/dL (ref 5–40)

## 2021-05-24 LAB — COMPREHENSIVE METABOLIC PANEL
ALT: 67 IU/L — ABNORMAL HIGH (ref 0–32)
AST: 44 IU/L — ABNORMAL HIGH (ref 0–40)
Albumin/Globulin Ratio: 1.9 (ref 1.2–2.2)
Albumin: 4.6 g/dL (ref 3.8–4.9)
Alkaline Phosphatase: 114 IU/L (ref 44–121)
BUN/Creatinine Ratio: 20 (ref 9–23)
BUN: 17 mg/dL (ref 6–24)
Bilirubin Total: 0.3 mg/dL (ref 0.0–1.2)
CO2: 26 mmol/L (ref 20–29)
Calcium: 9.4 mg/dL (ref 8.7–10.2)
Chloride: 102 mmol/L (ref 96–106)
Creatinine, Ser: 0.84 mg/dL (ref 0.57–1.00)
Globulin, Total: 2.4 g/dL (ref 1.5–4.5)
Glucose: 109 mg/dL — ABNORMAL HIGH (ref 70–99)
Potassium: 4.7 mmol/L (ref 3.5–5.2)
Sodium: 142 mmol/L (ref 134–144)
Total Protein: 7 g/dL (ref 6.0–8.5)
eGFR: 80 mL/min/{1.73_m2} (ref >59)

## 2021-05-24 LAB — HEMOGLOBIN A1C
Est. average glucose Bld gHb Est-mCnc: 123 mg/dL
Hgb A1c MFr Bld: 5.9 % — ABNORMAL HIGH (ref 4.8–5.6)

## 2021-05-24 LAB — TSH: TSH: 1.66 u[IU]/mL (ref 0.450–4.500)

## 2021-08-14 ENCOUNTER — Encounter: Payer: Commercial Managed Care - PPO | Admitting: Osteopathic Medicine

## 2022-01-07 ENCOUNTER — Encounter: Payer: Self-pay | Admitting: Physician Assistant

## 2022-01-07 ENCOUNTER — Ambulatory Visit (INDEPENDENT_AMBULATORY_CARE_PROVIDER_SITE_OTHER): Payer: Commercial Managed Care - PPO | Admitting: Physician Assistant

## 2022-01-07 VITALS — BP 131/80 | HR 58 | Ht 66.0 in | Wt 174.0 lb

## 2022-01-07 DIAGNOSIS — Z0000 Encounter for general adult medical examination without abnormal findings: Secondary | ICD-10-CM

## 2022-01-07 DIAGNOSIS — Z780 Asymptomatic menopausal state: Secondary | ICD-10-CM

## 2022-01-07 DIAGNOSIS — Z13220 Encounter for screening for lipoid disorders: Secondary | ICD-10-CM | POA: Diagnosis not present

## 2022-01-07 DIAGNOSIS — E7800 Pure hypercholesterolemia, unspecified: Secondary | ICD-10-CM

## 2022-01-07 DIAGNOSIS — Z1329 Encounter for screening for other suspected endocrine disorder: Secondary | ICD-10-CM | POA: Diagnosis not present

## 2022-01-07 DIAGNOSIS — C9110 Chronic lymphocytic leukemia of B-cell type not having achieved remission: Secondary | ICD-10-CM

## 2022-01-07 DIAGNOSIS — E663 Overweight: Secondary | ICD-10-CM

## 2022-01-07 DIAGNOSIS — H4052X3 Glaucoma secondary to other eye disorders, left eye, severe stage: Secondary | ICD-10-CM

## 2022-01-07 DIAGNOSIS — Z131 Encounter for screening for diabetes mellitus: Secondary | ICD-10-CM

## 2022-01-07 DIAGNOSIS — M5030 Other cervical disc degeneration, unspecified cervical region: Secondary | ICD-10-CM

## 2022-01-07 MED ORDER — WEGOVY 0.25 MG/0.5ML ~~LOC~~ SOAJ: 0.2500 mg | SUBCUTANEOUS | 0 refills | Status: DC

## 2022-01-07 MED ORDER — WEGOVY 0.5 MG/0.5ML ~~LOC~~ SOAJ: 0.5000 mg | SUBCUTANEOUS | 1 refills | Status: DC

## 2022-01-07 NOTE — Patient Instructions (Signed)
Semaglutide Injection (Weight Management) What is this medication? SEMAGLUTIDE (SEM a GLOO tide) promotes weight loss. It may also be used to maintain weight loss. It works by decreasing appetite. Changes to diet and exercise are often combined with this medication. This medicine may be used for other purposes; ask your health care provider or pharmacist if you have questions. COMMON BRAND NAME(S): [id number] What should I tell my care team before I take this medication? They need to know if you have any of these conditions: Endocrine tumors (MEN 2) or if someone in your family had these tumors Eye disease, vision problems Gallbladder disease History of depression or mental health disease History of pancreatitis Kidney disease Stomach or intestine problems Suicidal thoughts, plans, or attempt; a previous suicide attempt by you or a family member Thyroid cancer or if someone in your family had thyroid cancer An unusual or allergic reaction to semaglutide, other medications, foods, dyes, or preservatives Pregnant or trying to get pregnant Breast-feeding How should I use this medication? This medication is injected under the skin. You will be taught how to prepare and give it. Take it as directed on the prescription label. It is given once every week (every 7 days). Keep taking it unless your care team tells you to stop. It is important that you put your used needles and pens in a special sharps container. Do not put them in a trash can. If you do not have a sharps container, call your pharmacist or care team to get one. A special MedGuide will be given to you by the pharmacist with each prescription and refill. Be sure to read this information carefully each time. This medication comes with INSTRUCTIONS FOR USE. Ask your pharmacist for directions on how to use this medication. Read the information carefully. Talk to your pharmacist or care team if you have questions. Talk to your care team about  the use of this medication in children. While it may be prescribed for children as young as 12 years for selected conditions, precautions do apply. Overdosage: If you think you have taken too much of this medicine contact a poison control center or emergency room at once. NOTE: This medicine is only for you. Do not share this medicine with others. What if I miss a dose? If you miss a dose and the next scheduled dose is more than 2 days away, take the missed dose as soon as possible. If you miss a dose and the next scheduled dose is less than 2 days away, do not take the missed dose. Take the next dose at your regular time. Do not take double or extra doses. If you miss your dose for 2 weeks or more, take the next dose at your regular time or call your care team to talk about how to restart this medication. What may interact with this medication? Insulin and other medications for diabetes This list may not describe all possible interactions. Give your health care provider a list of all the medicines, herbs, non-prescription drugs, or dietary supplements you use. Also tell them if you smoke, drink alcohol, or use illegal drugs. Some items may interact with your medicine. What should I watch for while using this medication? Visit your care team for regular checks on your progress. It may be some time before you see the benefit from this medication. Drink plenty of fluids while taking this medication. Check with your care team if you have severe diarrhea, nausea, and vomiting, or if you sweat a  lot. The loss of too much body fluid may make it dangerous for you to take this medication. This medication may affect blood sugar levels. Ask your care team if changes in diet or medications are needed if you have diabetes. If you or your family notice any changes in your behavior, such as new or worsening depression, thoughts of harming yourself, anxiety, other unusual or disturbing thoughts, or memory loss, call  your care team right away. Women should inform their care team if they wish to become pregnant or think they might be pregnant. Losing weight while pregnant is not advised and may cause harm to the unborn child. Talk to your care team for more information. What side effects may I notice from receiving this medication? Side effects that you should report to your care team as soon as possible: Allergic reactions--skin rash, itching, hives, swelling of the face, lips, tongue, or throat Change in vision Dehydration--increased thirst, dry mouth, feeling faint or lightheaded, headache, dark yellow or brown urine Gallbladder problems--severe stomach pain, nausea, vomiting, fever Heart palpitations--rapid, pounding, or irregular heartbeat Kidney injury--decrease in the amount of urine, swelling of the ankles, hands, or feet Pancreatitis--severe stomach pain that spreads to your back or gets worse after eating or when touched, fever, nausea, vomiting Thoughts of suicide or self-harm, worsening mood, feelings of depression Thyroid cancer--new mass or lump in the neck, pain or trouble swallowing, trouble breathing, hoarseness Side effects that usually do not require medical attention (report to your care team if they continue or are bothersome): Diarrhea Loss of appetite Nausea Stomach pain Vomiting This list may not describe all possible side effects. Call your doctor for medical advice about side effects. You may report side effects to FDA at 1-800-FDA-1088. Where should I keep my medication? Keep out of the reach of children and pets. Refrigeration (preferred): Store in the refrigerator. Do not freeze. Keep this medication in the original container until you are ready to take it. Get rid of any unused medication after the expiration date. Room temperature: If needed, prior to cap removal, the pen can be stored at room temperature for up to 28 days. Protect from light. If it is stored at room  temperature, get rid of any unused medication after 28 days or after it expires, whichever is first. It is important to get rid of the medication as soon as you no longer need it or it is expired. You can do this in two ways: Take the medication to a medication take-back program. Check with your pharmacy or law enforcement to find a location. If you cannot return the medication, follow the directions in the [facility]. NOTE: This sheet is a summary. It may not cover all possible information. If you have questions about this medicine, talk to your doctor, pharmacist, or health care provider.  2023 Elsevier/Gold Standard (2021-07-31 00:00:00) Health Maintenance, Female Adopting a healthy lifestyle and getting preventive care are important in promoting health and wellness. Ask your health care provider about: The right schedule for you to have regular tests and exams. Things you can do on your own to prevent diseases and keep yourself healthy. What should I know about diet, weight, and exercise? Eat a healthy diet  Eat a diet that includes plenty of vegetables, fruits, low-fat dairy products, and lean protein. Do not eat a lot of foods that are high in solid fats, added sugars, or sodium. Maintain a healthy weight Body mass index (BMI) is used to identify weight problems. It estimates  body fat based on height and weight. Your health care provider can help determine your BMI and help you achieve or maintain a healthy weight. Get regular exercise Get regular exercise. This is one of the most important things you can do for your health. Most adults should: Exercise for at least 150 minutes each week. The exercise should increase your heart rate and make you sweat (moderate-intensity exercise). Do strengthening exercises at least twice a week. This is in addition to the moderate-intensity exercise. Spend less time sitting. Even light physical activity can be beneficial. Watch cholesterol and blood  lipids Have your blood tested for lipids and cholesterol at 61 years of age, then have this test every 5 years. Have your cholesterol levels checked more often if: Your lipid or cholesterol levels are high. You are older than 61 years of age. You are at high risk for heart disease. What should I know about cancer screening? Depending on your health history and family history, you may need to have cancer screening at various ages. This may include screening for: Breast cancer. Cervical cancer. Colorectal cancer. Skin cancer. Lung cancer. What should I know about heart disease, diabetes, and high blood pressure? Blood pressure and heart disease High blood pressure causes heart disease and increases the risk of stroke. This is more likely to develop in people who have high blood pressure readings or are overweight. Have your blood pressure checked: Every 3-5 years if you are 40-52 years of age. Every year if you are 88 years old or older. Diabetes Have regular diabetes screenings. This checks your fasting blood sugar level. Have the screening done: Once every three years after age 74 if you are at a normal weight and have a low risk for diabetes. More often and at a younger age if you are overweight or have a high risk for diabetes. What should I know about preventing infection? Hepatitis B If you have a higher risk for hepatitis B, you should be screened for this virus. Talk with your health care provider to find out if you are at risk for hepatitis B infection. Hepatitis C Testing is recommended for: Everyone born from [birth year] through 1965. Anyone with known risk factors for hepatitis C. Sexually transmitted infections (STIs) Get screened for STIs, including gonorrhea and chlamydia, if: You are sexually active and are younger than 61 years of age. You are older than 61 years of age and your health care provider tells you that you are at risk for this type of infection. Your sexual  activity has changed since you were last screened, and you are at increased risk for chlamydia or gonorrhea. Ask your health care provider if you are at risk. Ask your health care provider about whether you are at high risk for HIV. Your health care provider may recommend a prescription medicine to help prevent HIV infection. If you choose to take medicine to prevent HIV, you should first get tested for HIV. You should then be tested every 3 months for as long as you are taking the medicine. Pregnancy If you are about to stop having your period (premenopausal) and you may become pregnant, seek counseling before you get pregnant. Take 400 to 800 micrograms (mcg) of folic acid every day if you become pregnant. Ask for birth control (contraception) if you want to prevent pregnancy. Osteoporosis and menopause Osteoporosis is a disease in which the bones lose minerals and strength with aging. This can result in bone fractures. If you are 65 years  old or older, or if you are at risk for osteoporosis and fractures, ask your health care provider if you should: Be screened for bone loss. Take a calcium or vitamin D supplement to lower your risk of fractures. Be given hormone replacement therapy (HRT) to treat symptoms of menopause. Follow these instructions at home: Alcohol use Do not drink alcohol if: Your health care provider tells you not to drink. You are pregnant, may be pregnant, or are planning to become pregnant. If you drink alcohol: Limit how much you have to: 0-1 drink a day. Know how much alcohol is in your drink. In the [place] one drink equals one 12 oz bottle of beer (355 mL), one 5 oz glass of wine (148 mL), or one 1 oz glass of hard liquor (44 mL). Lifestyle Do not use any products that contain nicotine or tobacco. These products include cigarettes, chewing tobacco, and vaping devices, such as e-cigarettes. If you need help quitting, ask your health care provider. Do not use street  drugs. Do not share needles. Ask your health care provider for help if you need support or information about quitting drugs. General instructions Schedule regular health, dental, and eye exams. Stay current with your vaccines. Tell your health care provider if: You often feel depressed. You have ever been abused or do not feel safe at home. Summary Adopting a healthy lifestyle and getting preventive care are important in promoting health and wellness. Follow your health care provider's instructions about healthy diet, exercising, and getting tested or screened for diseases. Follow your health care provider's instructions on monitoring your cholesterol and blood pressure. This information is not intended to replace advice given to you by your health care provider. Make sure you discuss any questions you have with your health care provider. Document Revised: 12/03/2020 Document Reviewed: 12/03/2020 Elsevier Patient Education  [place].

## 2022-01-07 NOTE — Progress Notes (Signed)
Subjective:     Danielle Kim is a 61 y.o. female and is here for a comprehensive physical exam. The patient reports problems - she would like to work on weight loss. She tried diet and exercise on her own over the past year and did not get her results. She is ready to start medication.  .  Social History   Socioeconomic History   Marital status: Divorced    Spouse name: Not on file   Number of children: 3   Years of education: 15   Highest education level: Not on file  Occupational History   Occupation: Company secretary: [employer]  Tobacco Use   Smoking status: Former    Types: Cigarettes   Smokeless tobacco: Never   Tobacco comments:    quit 9 months ago  Vaping Use   Vaping Use: Never used  Substance and Sexual Activity   Alcohol use: Yes    Alcohol/week: 2.0 standard drinks of alcohol    Types: 2 Standard drinks or equivalent per week   Drug use: No   Sexual activity: Yes    Partners: Male    Birth control/protection: Post-menopausal  Other Topics Concern   Not on file  Social History Narrative   Patient is a single mother. Patient works for [employer]. Patient has college education.   Caffeine- three cups coffee daily.   Left handed.   Social Determinants of Health   Financial Resource Strain: Not on file  Food Insecurity: Not on file  Transportation Needs: Not on file  Physical Activity: Not on file  Stress: Not on file  Social Connections: Not on file  Intimate Partner Violence: Not on file   Health Maintenance  Topic Date Due   COVID-19 Vaccine (4 - Booster) 08/15/2020   INFLUENZA VACCINE  02/25/2022   MAMMOGRAM  04/03/2022   PAP SMEAR-Modifier  09/12/2022   COLONOSCOPY (Pts 45-54yr Insurance coverage will need to be confirmed)  09/15/2023   TETANUS/TDAP  03/30/2029   Hepatitis C Screening  Completed   HIV Screening  Completed   Zoster Vaccines- Shingrix  Completed   HPV VACCINES  Aged Out    The following  portions of the patient's history were reviewed and updated as appropriate: allergies, current medications, past family history, past medical history, past social history, past surgical history, and problem list.  Review of Systems A comprehensive review of systems was negative.   Objective:    BP 131/80   Pulse (!) 58   Ht '5\' 6"'$  (1.676 m)   Wt 174 lb (78.9 kg)   LMP 04/28/2015   SpO2 99%   BMI 28.08 kg/m  General appearance: alert, cooperative, and appears stated age Head: Normocephalic, without obvious abnormality, atraumatic Eyes: conjunctivae/corneas clear. PERRL, EOM's intact. Fundi benign. Ears: normal TM's and external ear canals both ears Nose: Nares normal. Septum midline. Mucosa normal. No drainage or sinus tenderness. Throat: lips, mucosa, and tongue normal; teeth and gums normal Neck: no adenopathy, no carotid bruit, no JVD, supple, symmetrical, trachea midline, and thyroid not enlarged, symmetric, no tenderness/mass/nodules Back: symmetric, no curvature. ROM normal. No CVA tenderness. Lungs: clear to auscultation bilaterally Heart: regular rate and rhythm, S1, S2 normal, no murmur, click, rub or gallop Abdomen: soft, non-tender; bowel sounds normal; no masses,  no organomegaly Extremities: extremities normal, atraumatic, no cyanosis or edema Pulses: 2+ and symmetric Skin: Skin color, texture, turgor normal. No rashes or lesions Lymph nodes: Cervical, supraclavicular, and  axillary nodes normal. Neurologic: Alert and oriented X 3, normal strength and tone. Normal symmetric reflexes. Normal coordination and gait    .Marland Kitchen    01/07/2022   10:54 AM 08/09/2020    2:48 PM 03/31/2019    7:50 AM 03/21/2019   11:19 AM  Depression screen PHQ 2/9  Decreased Interest 0 0 0 0  Down, Depressed, Hopeless 0 0 0 0  PHQ - 2 Score 0 0 0 0  Altered sleeping   0 0  Tired, decreased energy   0 0  Change in appetite   0 0  Feeling bad or failure about yourself    0 0  Trouble concentrating    0 0  Moving slowly or fidgety/restless   0 0  Suicidal thoughts   0 0  PHQ-9 Score   0 0  Difficult doing work/chores    Not difficult at all    Assessment:    Healthy female exam.    Plan:  Marland KitchenMarland KitchenMelody was seen today for annual exam.  Diagnoses and all orders for this visit:  Annual physical exam -     TSH -     Lipid Panel w/reflex Direct LDL -     COMPLETE METABOLIC PANEL WITH GFR -     CBC with Differential/Platelet -     C-reactive protein -     VITAMIN D 25 Hydroxy (Vit-D Deficiency, Fractures) -     B12 and Folate Panel  Elevated LDL cholesterol level -     Lipid Panel w/reflex Direct LDL  Thyroid disorder screen -     TSH  Lipid screening -     Lipid Panel w/reflex Direct LDL  Diabetes mellitus screening -     COMPLETE METABOLIC PANEL WITH GFR  Overweight (BMI 25.0-29.9) -     WEGOVY 0.25 MG/0.5ML SOAJ; Inject 0.25 mg into the skin once a week. Use this dose for 1 month (4 shots) and then increase to next higher dose. -     WEGOVY 0.5 MG/0.5ML SOAJ; Inject 0.5 mg into the skin once a week. Use this dose for 1 month (4 shots) and then increase to next higher dose.  CLL (chronic lymphocytic leukemia) (HCC) -     B12 and Folate Panel -     WEGOVY 0.25 MG/0.5ML SOAJ; Inject 0.25 mg into the skin once a week. Use this dose for 1 month (4 shots) and then increase to next higher dose. -     WEGOVY 0.5 MG/0.5ML SOAJ; Inject 0.5 mg into the skin once a week. Use this dose for 1 month (4 shots) and then increase to next higher dose.  Secondary open-angle glaucoma of left eye, severe stage  DDD (degenerative disc disease), cervical -     WEGOVY 0.25 MG/0.5ML SOAJ; Inject 0.25 mg into the skin once a week. Use this dose for 1 month (4 shots) and then increase to next higher dose. -     WEGOVY 0.5 MG/0.5ML SOAJ; Inject 0.5 mg into the skin once a week. Use this dose for 1 month (4 shots) and then increase to next higher dose.  Post-menopausal -     VITAMIN D 25 Hydroxy  (Vit-D Deficiency, Fractures)  .Marland Kitchen Discussed 150 minutes of exercise a week.  Encouraged vitamin D 1000 units and Calcium '1300mg'$  or 4 servings of dairy a day.  PHQ no concerns Fasting labs ordered Overweight with CLL and cervical DDD Discussed diet and exercise Discussed wegovy and how to use and titrate  up SE discussed Follow up in 3 months Mammogram/colonoscopy/pap UTD Covid vaccine x3 Shingrix UTD    See After Visit Summary for Counseling Recommendations

## 2022-01-10 ENCOUNTER — Encounter: Payer: Self-pay | Admitting: Physician Assistant

## 2022-01-10 DIAGNOSIS — E559 Vitamin D deficiency, unspecified: Secondary | ICD-10-CM | POA: Insufficient documentation

## 2022-01-10 NOTE — Progress Notes (Signed)
[name],   B12 looks great.  CRP(inflammation) looks great.  Thyroid normal.  Vitamin D low. Start 2000 units daily of D3.  Liver enzymes improved.  Glucose is up some. Please add A1C, JJ.  Kidney function looks good.   Marland Kitchen.The 10-year ASCVD risk score (Arnett DK, et al., 2019) is: 3.5%   Values used to calculate the score:     Age: 61 years     Sex: Female     Is Non-Hispanic African American: No     Diabetic: No     Tobacco smoker: No     Systolic Blood Pressure: 706 mmHg     Is BP treated: No     HDL Cholesterol: 70 mg/dL     Total Cholesterol: 253 mg/dL  LDL is up. HDL looks great. Overall 10 year risk is under 7.5 percent. Which is good.  I would start a low dose statin to decrease your CV risk. Thoughts?    Hemoglobin good.  WBC is up some from 7 months ago. When do you see Dr. Marin Olp again. I will forward CBC to him.

## 2022-01-11 LAB — B12 AND FOLATE PANEL
Folate: 14.8 ng/mL
Vitamin B-12: 845 pg/mL (ref 200–1100)

## 2022-01-11 LAB — CBC WITH DIFFERENTIAL/PLATELET
Absolute Monocytes: 762 cells/uL (ref 200–950)
Basophils Absolute: 109 cells/uL (ref 0–200)
Basophils Relative: 0.4 %
Eosinophils Absolute: 354 cells/uL (ref 15–500)
Eosinophils Relative: 1.3 %
HCT: 43.1 % (ref 35.0–45.0)
Hemoglobin: 14.1 g/dL (ref 11.7–15.5)
Lymphs Abs: 22494 cells/uL — ABNORMAL HIGH (ref 850–3900)
MCH: 29 pg (ref 27.0–33.0)
MCHC: 32.7 g/dL (ref 32.0–36.0)
MCV: 88.7 fL (ref 80.0–100.0)
MPV: 10.8 fL (ref 7.5–12.5)
Monocytes Relative: 2.8 %
Neutro Abs: 3482 cells/uL (ref 1500–7800)
Neutrophils Relative %: 12.8 %
Platelets: 247 10*3/uL (ref 140–400)
RBC: 4.86 10*6/uL (ref 3.80–5.10)
RDW: 13.1 % (ref 11.0–15.0)
Total Lymphocyte: 82.7 %
WBC: 27.2 10*3/uL — ABNORMAL HIGH (ref 3.8–10.8)

## 2022-01-11 LAB — LIPID PANEL W/REFLEX DIRECT LDL
Cholesterol: 253 mg/dL — ABNORMAL HIGH (ref <200)
HDL: 70 mg/dL (ref >=50)
LDL Cholesterol (Calc): 160 mg/dL (calc) — ABNORMAL HIGH
Non-HDL Cholesterol (Calc): 183 mg/dL (calc) — ABNORMAL HIGH (ref <130)
Total CHOL/HDL Ratio: 3.6 (calc) (ref <5.0)
Triglycerides: 110 mg/dL (ref <150)

## 2022-01-11 LAB — TSH: TSH: 1.71 mIU/L (ref 0.40–4.50)

## 2022-01-11 LAB — COMPLETE METABOLIC PANEL WITH GFR
AG Ratio: 1.7 (calc) (ref 1.0–2.5)
ALT: 44 U/L — ABNORMAL HIGH (ref 6–29)
AST: 26 U/L (ref 10–35)
Albumin: 4.4 g/dL (ref 3.6–5.1)
Alkaline phosphatase (APISO): 111 U/L (ref 37–153)
BUN: 21 mg/dL (ref 7–25)
CO2: 24 mmol/L (ref 20–32)
Calcium: 9.2 mg/dL (ref 8.6–10.4)
Chloride: 103 mmol/L (ref 98–110)
Creat: 0.78 mg/dL (ref 0.50–1.05)
Globulin: 2.6 g/dL (calc) (ref 1.9–3.7)
Glucose, Bld: 107 mg/dL — ABNORMAL HIGH (ref 65–99)
Potassium: 4.3 mmol/L (ref 3.5–5.3)
Sodium: 138 mmol/L (ref 135–146)
Total Bilirubin: 0.5 mg/dL (ref 0.2–1.2)
Total Protein: 7 g/dL (ref 6.1–8.1)
eGFR: 87 mL/min/{1.73_m2} (ref >=60)

## 2022-01-11 LAB — VITAMIN D 25 HYDROXY (VIT D DEFICIENCY, FRACTURES): Vit D, 25-Hydroxy: 25 ng/mL — ABNORMAL LOW (ref 30–100)

## 2022-01-11 LAB — C-REACTIVE PROTEIN: CRP: 2.1 mg/L (ref <8.0)

## 2022-01-11 LAB — HEMOGLOBIN A1C W/OUT EAG: Hgb A1c MFr Bld: 5.7 % of total Hgb — ABNORMAL HIGH (ref <5.7)

## 2022-01-13 NOTE — Progress Notes (Signed)
A1C is 5.7. Pre-diabetes range but better than 7 months ago. Keep low sugar/carb diet and exercise regular. Drink plenty of water.   Thoughts on statin for cholesterol?

## 2022-01-24 ENCOUNTER — Telehealth: Payer: Self-pay | Admitting: Neurology

## 2022-01-24 NOTE — Telephone Encounter (Signed)
Patient called to see if Danielle Kim has been approved. No notes about this in chart. Have you received request?

## 2022-01-30 ENCOUNTER — Telehealth: Payer: Self-pay

## 2022-01-30 NOTE — Telephone Encounter (Addendum)
Initiated Prior authorization [id number] 0.25 MG/0.5ML  Via: [email or web address] Case/Key:[id number] Status: denied as of 01/30/22 Reason:weight loss medication are not a covered medical benefit  Notified Pt via: [place] (EOC) ID: [id number] Drug/Service Name: Mancel Parsons 0.25 MG/0.5 ML PEN Patient: Danielle Kim Date Requested: 01/30/2022 9:52:47 AM   MemberID: [id number] DOB: [birth year]

## 2022-02-18 ENCOUNTER — Telehealth: Payer: Self-pay | Admitting: General Practice

## 2022-02-18 NOTE — Telephone Encounter (Signed)
Transition Care Management Follow-up Telephone Call Date of discharge and from where: 02/16/22 from [facility] How have you been since you were released from the hospital? Still has some facial swelling from the fall. But doing good otherwise. Patient will call back to make an appointment if needed. Any questions or concerns? No  Items Reviewed: Did the pt receive and understand the discharge instructions provided? Yes  Medications obtained and verified? No  Other? No  Any new allergies since your discharge? No  Dietary orders reviewed? Yes Do you have support at home? Yes   Home Care and Equipment/Supplies: Were home health services ordered? no  Functional Questionnaire: (I = Independent and D = Dependent) ADLs: i  Bathing/Dressing- i  Meal Prep- i  Eating- I  Maintaining continence- I  Transferring/Ambulation- I  Managing Meds- I  Follow up appointments reviewed:  PCP Hospital f/u appt confirmed? No   Specialist Hospital f/u appt confirmed? No   Are transportation arrangements needed? No  If their condition worsens, is the pt aware to call PCP or go to the Emergency Dept.? Yes Was the patient provided with contact information for the PCP's office or ED? Yes Was to pt encouraged to call back with questions or concerns? Yes

## 2022-02-26 ENCOUNTER — Ambulatory Visit (INDEPENDENT_AMBULATORY_CARE_PROVIDER_SITE_OTHER): Payer: Commercial Managed Care - PPO

## 2022-02-26 ENCOUNTER — Encounter: Payer: Self-pay | Admitting: Sports Medicine

## 2022-02-26 ENCOUNTER — Ambulatory Visit: Payer: Commercial Managed Care - PPO | Admitting: Sports Medicine

## 2022-02-26 DIAGNOSIS — R0789 Other chest pain: Secondary | ICD-10-CM | POA: Diagnosis not present

## 2022-02-26 NOTE — Progress Notes (Signed)
    Procedures performed today:    None.  Independent interpretation of notes and tests performed by another provider:   None.  Brief History, Exam, Impression, and Recommendations:    Sternal pain This is a pleasant 61 year old female, a week ago she tripped over her dishwasher, had a fall, had severe pain midsternal, hit her face and head, sounds like she was fairly concussed after the impact, she was ultimately seen in the emergency department, forearm, chest x-ray as well as a head CT were all negative for fracture. She continues to have severe sternal pain. On exam she has tenderness mid sternum, we will proceed with sternal series x-rays, she declined chest CT at this time, declines medications, return to see me as needed.    ____________________________________________ Gwen Her. Dianah Field, M.D., ABFM., CAQSM., AME. Primary Care and Sports Medicine  [facility]  Adjunct Professor of [place] [employer] [facility] [employer]  Risk manager

## 2022-02-26 NOTE — Assessment & Plan Note (Signed)
This is a pleasant 61 year old female, a week ago she tripped over her dishwasher, had a fall, had severe pain midsternal, hit her face and head, sounds like she was fairly concussed after the impact, she was ultimately seen in the emergency department, forearm, chest x-ray as well as a head CT were all negative for fracture. She continues to have severe sternal pain. On exam she has tenderness mid sternum, we will proceed with sternal series x-rays, she declined chest CT at this time, declines medications, return to see me as needed.

## 2022-03-20 ENCOUNTER — Other Ambulatory Visit: Payer: Self-pay | Admitting: Physician Assistant

## 2022-03-20 ENCOUNTER — Telehealth: Payer: Self-pay | Admitting: Physician Assistant

## 2022-03-20 DIAGNOSIS — C9110 Chronic lymphocytic leukemia of B-cell type not having achieved remission: Secondary | ICD-10-CM

## 2022-03-20 DIAGNOSIS — E663 Overweight: Secondary | ICD-10-CM

## 2022-03-20 DIAGNOSIS — M5030 Other cervical disc degeneration, unspecified cervical region: Secondary | ICD-10-CM

## 2022-03-20 NOTE — Telephone Encounter (Signed)
Pt called.  She is following up on prior auth for wegovvy. She stated that she was told by someone on 6/13 that she would get a call back.

## 2022-03-24 NOTE — Telephone Encounter (Signed)
PA

## 2022-04-07 ENCOUNTER — Other Ambulatory Visit (HOSPITAL_BASED_OUTPATIENT_CLINIC_OR_DEPARTMENT_OTHER): Payer: Self-pay | Admitting: Obstetrics & Gynecology

## 2022-04-07 DIAGNOSIS — F329 Major depressive disorder, single episode, unspecified: Secondary | ICD-10-CM

## 2022-04-07 DIAGNOSIS — A6004 Herpesviral vulvovaginitis: Secondary | ICD-10-CM

## 2022-04-07 MED ORDER — VALACYCLOVIR HCL 500 MG PO TABS: 500.0000 mg | ORAL_TABLET | Freq: Every day | ORAL | 0 refills | Status: DC

## 2022-04-07 MED ORDER — SERTRALINE HCL 50 MG PO TABS: 75.0000 mg | ORAL_TABLET | Freq: Every day | ORAL | 0 refills | Status: DC

## 2022-04-08 ENCOUNTER — Telehealth: Payer: Self-pay | Admitting: Physician Assistant

## 2022-04-08 MED ORDER — PANTOPRAZOLE SODIUM 40 MG PO TBEC: 40.0000 mg | DELAYED_RELEASE_TABLET | Freq: Every day | ORAL | 1 refills | Status: DC

## 2022-04-08 NOTE — Telephone Encounter (Signed)
Pt called in today requesting refill on pantoprazole 90 day supply. Pharmacy on file is correct. Pt will be leaving on Saturday 09/16 to go out of town for 1 1/2 months.

## 2022-04-08 NOTE — Telephone Encounter (Signed)
[facility] to [facility]

## 2022-07-04 ENCOUNTER — Other Ambulatory Visit (HOSPITAL_BASED_OUTPATIENT_CLINIC_OR_DEPARTMENT_OTHER): Payer: Self-pay | Admitting: Obstetrics & Gynecology

## 2022-07-04 DIAGNOSIS — F329 Major depressive disorder, single episode, unspecified: Secondary | ICD-10-CM

## 2022-08-01 ENCOUNTER — Ambulatory Visit (HOSPITAL_BASED_OUTPATIENT_CLINIC_OR_DEPARTMENT_OTHER): Payer: Commercial Managed Care - PPO | Admitting: Medical

## 2022-11-03 ENCOUNTER — Telehealth (HOSPITAL_BASED_OUTPATIENT_CLINIC_OR_DEPARTMENT_OTHER): Payer: Self-pay | Admitting: *Deleted

## 2022-11-03 DIAGNOSIS — F329 Major depressive disorder, single episode, unspecified: Secondary | ICD-10-CM

## 2022-11-03 MED ORDER — SERTRALINE HCL 50 MG PO TABS
75.0000 mg | ORAL_TABLET | Freq: Every day | ORAL | 1 refills | Status: DC
Start: 2022-11-03 — End: 2023-04-14

## 2022-11-03 NOTE — Telephone Encounter (Signed)
Per Dr Hyacinth Meeker - pt has to keep Aug apt due to it being almost 2 years since last visit.  Generic Zoloft refill sent in with refills til then per Dr Hyacinth Meeker.  Pt understands will be put on waitlist.  Pt had Covid in January and then worked remote in [place] for a few months but is back now.

## 2022-11-10 ENCOUNTER — Other Ambulatory Visit (HOSPITAL_BASED_OUTPATIENT_CLINIC_OR_DEPARTMENT_OTHER): Payer: Self-pay | Admitting: Obstetrics & Gynecology

## 2022-11-10 DIAGNOSIS — A6004 Herpesviral vulvovaginitis: Secondary | ICD-10-CM

## 2022-12-19 ENCOUNTER — Telehealth: Payer: Self-pay | Admitting: Physician Assistant

## 2022-12-19 MED ORDER — PANTOPRAZOLE SODIUM 40 MG PO TBEC: 40.0000 mg | DELAYED_RELEASE_TABLET | Freq: Every day | ORAL | 0 refills | Status: DC

## 2022-12-19 NOTE — Telephone Encounter (Signed)
Patient is requesting a refill Pantoprazole 40mg  she is out  [facility] [address] [place] [phone]

## 2022-12-19 NOTE — Telephone Encounter (Signed)
90 day sent to the pharmacy because she has a physical in July.

## 2023-01-22 ENCOUNTER — Other Ambulatory Visit: Payer: Self-pay | Admitting: Physician Assistant

## 2023-01-22 ENCOUNTER — Ambulatory Visit (INDEPENDENT_AMBULATORY_CARE_PROVIDER_SITE_OTHER): Payer: Commercial Managed Care - PPO

## 2023-01-22 DIAGNOSIS — Z1231 Encounter for screening mammogram for malignant neoplasm of breast: Secondary | ICD-10-CM

## 2023-01-26 NOTE — Progress Notes (Signed)
Normal mammogram. Follow up in 1 year.

## 2023-01-28 ENCOUNTER — Encounter: Payer: Self-pay | Admitting: Physician Assistant

## 2023-01-28 ENCOUNTER — Ambulatory Visit: Payer: Commercial Managed Care - PPO

## 2023-01-28 ENCOUNTER — Ambulatory Visit (INDEPENDENT_AMBULATORY_CARE_PROVIDER_SITE_OTHER): Payer: Commercial Managed Care - PPO | Admitting: Physician Assistant

## 2023-01-28 VITALS — BP 132/72 | HR 63 | Ht 66.0 in | Wt 181.0 lb

## 2023-01-28 DIAGNOSIS — Z131 Encounter for screening for diabetes mellitus: Secondary | ICD-10-CM | POA: Diagnosis not present

## 2023-01-28 DIAGNOSIS — H4052X3 Glaucoma secondary to other eye disorders, left eye, severe stage: Secondary | ICD-10-CM

## 2023-01-28 DIAGNOSIS — Z780 Asymptomatic menopausal state: Secondary | ICD-10-CM

## 2023-01-28 DIAGNOSIS — M810 Age-related osteoporosis without current pathological fracture: Secondary | ICD-10-CM | POA: Insufficient documentation

## 2023-01-28 DIAGNOSIS — Z0000 Encounter for general adult medical examination without abnormal findings: Secondary | ICD-10-CM

## 2023-01-28 DIAGNOSIS — Z13220 Encounter for screening for lipoid disorders: Secondary | ICD-10-CM

## 2023-01-28 DIAGNOSIS — E663 Overweight: Secondary | ICD-10-CM

## 2023-01-28 DIAGNOSIS — K219 Gastro-esophageal reflux disease without esophagitis: Secondary | ICD-10-CM

## 2023-01-28 LAB — CBC WITH DIFFERENTIAL/PLATELET
Basophils Relative: 0.3 %
HCT: 42.6 % (ref 35.0–45.0)
Hemoglobin: 13.9 g/dL (ref 11.7–15.5)
MCH: 29.2 pg (ref 27.0–33.0)
MCHC: 32.6 g/dL (ref 32.0–36.0)
MPV: 10.4 fL (ref 7.5–12.5)
Monocytes Relative: 2.3 %
Platelets: 240 10*3/uL (ref 140–400)
Total Lymphocyte: 85 %
WBC: 28.4 10*3/uL — ABNORMAL HIGH (ref 3.8–10.8)

## 2023-01-28 MED ORDER — BUPROPION HCL ER (SR) 150 MG PO TB12
150.0000 mg | ORAL_TABLET | Freq: Two times a day (BID) | ORAL | 2 refills | Status: DC
Start: 2023-01-28 — End: 2023-02-20

## 2023-01-28 MED ORDER — PANTOPRAZOLE SODIUM 40 MG PO TBEC
40.0000 mg | DELAYED_RELEASE_TABLET | Freq: Every day | ORAL | 3 refills | Status: DC
Start: 2023-01-28 — End: 2024-03-08

## 2023-01-28 NOTE — Progress Notes (Signed)
Complete physical exam  Patient: Danielle Kim   DOB: Mar 13, 1961   62 y.o. Female  MRN: [id number]  Subjective:    Chief Complaint  Patient presents with   Annual Exam   Gynecologic Exam    Danielle Kim is a 62 y.o. female who presents today for a complete physical exam. She reports consuming a general diet.  Current patient is not exercising.   She generally feels well. She reports sleeping well. She does have additional problems to discuss today.   Pt would like help losing weight. Insurance will not pay for wegovy. She is not exercising but plans to join gym today. She admits she eats more than she should.    Most recent fall risk assessment:    01/28/2023    7:59 AM  Fall Risk   Falls in the past year? 1  Number falls in past yr: 0  Injury with Fall? 1  Risk for fall due to : History of fall(s)  Follow up Falls evaluation completed     Most recent depression screenings:    01/28/2023    7:59 AM 01/07/2022   10:54 AM  PHQ 2/9 Scores  PHQ - 2 Score 2 0  PHQ- 9 Score 9     Vision:Within last year and Dental: No current dental problems and Receives regular dental care  Patient Active Problem List   Diagnosis Date Noted   Sternal pain 02/26/2022   Vitamin D insufficiency 01/10/2022   Overweight (BMI 25.0-29.9) 01/07/2022   Post-menopausal 04/04/2021   Reactive depression 04/04/2021   Herpes simplex vulvovaginitis 04/04/2021   Vertebral compression fracture (HCC) 09/28/2020   Numbness around mouth 09/23/2018   DDD (degenerative disc disease), cervical 08/23/2018   Primary osteoarthritis of first carpometacarpal joint of one hand, right 08/23/2018   CLL (chronic lymphocytic leukemia) (HCC) 09/02/2017   Trigger middle finger of right hand 11/07/2015   Insomnia 03/31/2014   Pain around left eye    Secondary open-angle glaucoma of left eye, severe stage 02/07/2013   Past Medical History:  Diagnosis Date   Anxiety    CLL (chronic lymphocytic leukemia) (HCC)     Cluster headaches    Depression    Other specified glaucoma    Pain around left eye     several bouts  sicne 2011.    Past Surgical History:  Procedure Laterality Date   CERVICAL DISC SURGERY     2001   REDUCTION MAMMAPLASTY     Family History  Problem Relation Age of Onset   Diabetes Father    Stroke Mother    Heart attack Mother    Stomach cancer Maternal Aunt    Down syndrome Sister    Colon cancer Neg Hx    No Known Allergies    Patient Care Team: Nolene Ebbs as PCP - General (Family Medicine)   Outpatient Medications Prior to Visit  Medication Sig   ALPRAZolam (XANAX) 0.5 MG tablet 1 tablet daily prn for anxiety   Multiple Vitamin (MULTIVITAMIN) tablet Take 1 tablet by mouth daily.   sertraline (ZOLOFT) 50 MG tablet Take 1.5 tablets (75 mg total) by mouth daily.   timolol (BETIMOL) 0.25 % ophthalmic solution 1-2 drops 2 (two) times daily.   TRAVATAN Z 0.004 % SOLN ophthalmic solution PLACE 1 DROP INTO THE LEFT EYE NIGHTLY   valACYclovir (VALTREX) 500 MG tablet TAKE 1 TABLET (500 MG TOTAL) BY MOUTH DAILY.   [DISCONTINUED] pantoprazole (PROTONIX) 40 MG tablet Take 1  tablet (40 mg total) by mouth daily.   [DISCONTINUED] WEGOVY 0.25 MG/0.5ML SOAJ Inject 0.25 mg into the skin once a week. Use this dose for 1 month (4 shots) and then increase to next higher dose. (Patient not taking: Reported on 01/28/2023)   [DISCONTINUED] WEGOVY 0.5 MG/0.5ML SOAJ Inject 0.5 mg into the skin once a week. Use this dose for 1 month (4 shots) and then increase to next higher dose.   No facility-administered medications prior to visit.    ROS        Objective:     BP (!) 146/73 (BP Location: Left Arm, Patient Position: Sitting, Cuff Size: Normal)   Pulse 63   Ht 5\' 6"  (1.676 m)   Wt 181 lb (82.1 kg)   LMP 04/28/2015   SpO2 97%   BMI 29.21 kg/m  BP Readings from Last 3 Encounters:  01/28/23 (!) 146/73  02/26/22 126/80  01/07/22 131/80   Wt Readings from Last 3  Encounters:  01/28/23 181 lb (82.1 kg)  01/07/22 174 lb (78.9 kg)  04/03/21 170 lb (77.1 kg)      Physical Exam  BP 132/72   Pulse 63   Ht 5\' 6"  (1.676 m)   Wt 181 lb (82.1 kg)   LMP 04/28/2015   SpO2 97%   BMI 29.21 kg/m   General Appearance:    Alert, cooperative, overweight no distress, appears stated age  Head:    Normocephalic, without obvious abnormality, atraumatic  Eyes:    PERRL, conjunctiva/corneas clear, EOM's intact, fundi    benign, both eyes  Ears:    Normal TM's and external ear canals, both ears  Nose:   Nares normal, septum midline, mucosa normal, no drainage    or sinus tenderness  Throat:   Lips, mucosa, and tongue normal; teeth and gums normal  Neck:   Supple, symmetrical, trachea midline, no adenopathy;    thyroid:  no enlargement/tenderness/nodules; no carotid   bruit or JVD  Back:     Symmetric, no curvature, ROM normal, no CVA tenderness  Lungs:     Clear to auscultation bilaterally, respirations unlabored  Chest Wall:    No tenderness or deformity   Heart:    Regular rate and rhythm, S1 and S2 normal, no murmur, rub   or gallop     Abdomen:     Soft, non-tender, bowel sounds active all four quadrants,    no masses, no organomegaly        Extremities:   Extremities normal, atraumatic, no cyanosis or edema  Pulses:   2+ and symmetric all extremities  Skin:   Skin color, texture, turgor normal, no rashes or lesions  Lymph nodes:   Cervical, supraclavicular, and axillary nodes normal  Neurologic:   CNII-XII intact, normal strength, sensation and reflexes    throughout    Assessment & Plan:    Routine Health Maintenance and Physical Exam  Immunization History  Administered Date(s) Administered   Fluad Quad(high Dose 65+) 06/20/2020   Influenza,inj,Quad PF,6+ Mos 06/05/2018, 03/31/2019   Influenza-Unspecified 06/10/2017   Moderna Sars-Covid-2 Vaccination 06/20/2020   PFIZER(Purple Top)SARS-COV-2 Vaccination 10/21/2019, 11/18/2019    Pneumococcal Conjugate-13 09/19/2019   Tdap 03/31/2019   Zoster Recombinant(Shingrix) 04/17/2016, 04/07/2017, 09/02/2017    Health Maintenance  Topic Date Due   COVID-19 Vaccine (4 - 2023-24 season) 02/13/2023 (Originally 03/28/2022)   INFLUENZA VACCINE  02/26/2023   Colonoscopy  09/15/2023   MAMMOGRAM  01/22/2024   PAP SMEAR-Modifier  09/12/2024   DTaP/Tdap/Td (2 -  Td or Tdap) 03/30/2029   Hepatitis C Screening  Completed   HIV Screening  Completed   Zoster Vaccines- Shingrix  Completed   HPV VACCINES  Aged Out    Discussed health benefits of physical activity, and encouraged her to engage in regular exercise appropriate for her age and condition.   Return in about 3 months (around 04/30/2023) for weight.   Marland Kitchen.Laurey was seen today for annual exam and gynecologic exam.  Diagnoses and all orders for this visit:  Routine physical examination -     TSH -     Lipid Panel w/reflex Direct LDL -     COMPLETE METABOLIC PANEL WITH GFR -     CBC with Differential/Platelet -     VITAMIN D 25 Hydroxy (Vit-D Deficiency, Fractures) -     DG Bone Density  Secondary open-angle glaucoma of left eye, severe stage  Screening for lipid disorders -     Lipid Panel w/reflex Direct LDL  Screening for diabetes mellitus -     COMPLETE METABOLIC PANEL WITH GFR  Post-menopausal -     VITAMIN D 25 Hydroxy (Vit-D Deficiency, Fractures) -     DG Bone Density  Overweight (BMI 25.0-29.9) -     buPROPion (WELLBUTRIN SR) 150 MG 12 hr tablet; Take 1 tablet (150 mg total) by mouth 2 (two) times daily.  Gastroesophageal reflux disease without esophagitis -     pantoprazole (PROTONIX) 40 MG tablet; Take 1 tablet (40 mg total) by mouth daily.   .. Discussed 150 minutes of exercise a week.  Encouraged vitamin D 1000 units and Calcium 1300mg  or 4 servings of dairy a day.  Vitals look good PHQ no concerns Fasting labs ordered Mammogram UTD Colonoscopy next week Pap UTD Bone density ordered at  patients request-long term PPI use Shingrix UTD   .Marland KitchenDiscussed low carb diet with 1500 calories and 80g of protein.  Exercising at least 150 minutes a week.  [employer] could be a Chief Technology Officer.  Insurance will not pay for medication  Wellbutrin SR bid started Discussed SE Consider IF 16 to 8 HO given Follow up in 3 months  Glaucoma managed by eye doctor   Tandy Gaw, PA-C

## 2023-01-28 NOTE — Progress Notes (Signed)
Your bone density does show osteoporotic range but just in that range. You have to start vitamin D 2000 units and calcium 1200mg  daily. I would like to discuss medications with you. Can schedule virtual.

## 2023-01-28 NOTE — Patient Instructions (Signed)

## 2023-01-29 LAB — LIPID PANEL W/REFLEX DIRECT LDL
Cholesterol: 247 mg/dL — ABNORMAL HIGH
HDL: 62 mg/dL
LDL Cholesterol (Calc): 153 mg/dL — ABNORMAL HIGH
Non-HDL Cholesterol (Calc): 185 mg/dL — ABNORMAL HIGH
Total CHOL/HDL Ratio: 4 (calc)
Triglycerides: 183 mg/dL — ABNORMAL HIGH

## 2023-01-29 LAB — COMPLETE METABOLIC PANEL WITHOUT GFR
AG Ratio: 1.8 (calc) (ref 1.0–2.5)
ALT: 52 U/L — ABNORMAL HIGH (ref 6–29)
AST: 27 U/L (ref 10–35)
Albumin: 4.6 g/dL (ref 3.6–5.1)
Alkaline phosphatase (APISO): 121 U/L (ref 37–153)
BUN: 20 mg/dL (ref 7–25)
CO2: 29 mmol/L (ref 20–32)
Calcium: 9.4 mg/dL (ref 8.6–10.4)
Chloride: 99 mmol/L (ref 98–110)
Creat: 0.85 mg/dL (ref 0.50–1.05)
Globulin: 2.5 g/dL (ref 1.9–3.7)
Glucose, Bld: 106 mg/dL — ABNORMAL HIGH (ref 65–99)
Potassium: 4.7 mmol/L (ref 3.5–5.3)
Sodium: 136 mmol/L (ref 135–146)
Total Bilirubin: 0.4 mg/dL (ref 0.2–1.2)
Total Protein: 7.1 g/dL (ref 6.1–8.1)
eGFR: 78 mL/min/1.73m2

## 2023-01-29 LAB — CBC WITH DIFFERENTIAL/PLATELET
Absolute Monocytes: 653 cells/uL (ref 200–950)
Basophils Absolute: 85 cells/uL (ref 0–200)
Eosinophils Absolute: 170 cells/uL (ref 15–500)
Eosinophils Relative: 0.6 %
Lymphs Abs: 24140 cells/uL — ABNORMAL HIGH (ref 850–3900)
MCV: 89.5 fL (ref 80.0–100.0)
Neutro Abs: 3351 cells/uL (ref 1500–7800)
Neutrophils Relative %: 11.8 %
RBC: 4.76 10*6/uL (ref 3.80–5.10)
RDW: 13.5 % (ref 11.0–15.0)

## 2023-01-29 LAB — VITAMIN D 25 HYDROXY (VIT D DEFICIENCY, FRACTURES): Vit D, 25-Hydroxy: 30 ng/mL (ref 30–100)

## 2023-01-29 LAB — TSH: TSH: 1.56 m[IU]/L (ref 0.40–4.50)

## 2023-01-30 NOTE — Progress Notes (Signed)
[name],   Thyroid looks good.  Vitamin D in normal range but low normal. Increase vitamin D to 2000 units daily with dairy.   Fasting glucose is elevated. Add A1C to labs to evaluate for diabetes.   LDL and TG are elevated. CV risk is below 7.5 percent right now. Continue with diet and exercise.   Marland Kitchen.The 10-year ASCVD risk score (Arnett DK, et al., 2019) is: 4.2%   Values used to calculate the score:     Age: 62 years     Sex: Female     Is Non-Hispanic African American: No     Diabetic: No     Tobacco smoker: No     Systolic Blood Pressure: 132 mmHg     Is BP treated: No     HDL Cholesterol: 62 mg/dL     Total Cholesterol: 247 mg/dL

## 2023-02-03 ENCOUNTER — Ambulatory Visit (HOSPITAL_BASED_OUTPATIENT_CLINIC_OR_DEPARTMENT_OTHER): Payer: Commercial Managed Care - PPO | Admitting: Advanced Practice Midwife

## 2023-02-04 ENCOUNTER — Encounter: Payer: Self-pay | Admitting: Physician Assistant

## 2023-02-04 ENCOUNTER — Telehealth (INDEPENDENT_AMBULATORY_CARE_PROVIDER_SITE_OTHER): Payer: Commercial Managed Care - PPO | Admitting: Physician Assistant

## 2023-02-04 DIAGNOSIS — R7301 Impaired fasting glucose: Secondary | ICD-10-CM | POA: Insufficient documentation

## 2023-02-04 DIAGNOSIS — E7800 Pure hypercholesterolemia, unspecified: Secondary | ICD-10-CM | POA: Diagnosis not present

## 2023-02-04 DIAGNOSIS — M810 Age-related osteoporosis without current pathological fracture: Secondary | ICD-10-CM | POA: Diagnosis not present

## 2023-02-04 DIAGNOSIS — R7401 Elevation of levels of liver transaminase levels: Secondary | ICD-10-CM | POA: Diagnosis not present

## 2023-02-04 DIAGNOSIS — C9110 Chronic lymphocytic leukemia of B-cell type not having achieved remission: Secondary | ICD-10-CM

## 2023-02-04 NOTE — Patient Instructions (Signed)

## 2023-02-04 NOTE — Progress Notes (Signed)
..  Virtual Visit via Video Note  I connected with Tenisha J Verrilli on 02/04/23 at  1:40 PM EDT by a video enabled telemedicine application and verified that I am speaking with the correct person using two identifiers.  Location: Patient: home Provider: clinic  .Marland KitchenParticipating in visit:  Patient: Danielle Kim Provider: Tandy Gaw PA-C   I discussed the limitations of evaluation and management by telemedicine and the availability of in person appointments. The patient expressed understanding and agreed to proceed.  History of Present Illness: Pt is a 62 yo female with CLL who calls into the clinic to discuss labs and bone density results.   ASSESSMENT: The BMD measured at AP Spine L1-L4 is 0.880 g/cm2 with a T-score of -2.5. This patient is considered osteoporotic according to World Health Organization [facility]) criteria. The scan quality is good.   Site Region Measured Date Measured Age WHO YA BMD Classification T-score AP Spine L1-L4 01/28/2023 61.5 Osteoporosis -2.5 0.880 g/cm2   DualFemur Neck Right 1/28/2023 [age] years Low Bone Mass -1.3 0.856 g/cm2    .Marland Kitchen Active Ambulatory Problems    Diagnosis Date Noted   Pain around left eye    Insomnia 03/31/2014   Secondary open-angle glaucoma of left eye, severe stage 02/07/2013   Trigger middle finger of right hand 11/07/2015   CLL (chronic lymphocytic leukemia) (HCC) 09/02/2017   DDD (degenerative disc disease), cervical 08/23/2018   Primary osteoarthritis of first carpometacarpal joint of one hand, right 08/23/2018   Numbness around mouth 09/23/2018   Vertebral compression fracture (HCC) 09/28/2020   Post-menopausal 04/04/2021   Reactive depression 04/04/2021   Herpes simplex vulvovaginitis 04/04/2021   Overweight (BMI 25.0-29.9) 01/07/2022   Vitamin D insufficiency 01/10/2022   Sternal pain 02/26/2022   Osteoporosis 01/28/2023   Elevated LDL cholesterol level 02/04/2023   Elevated ALT measurement 02/04/2023   Elevated fasting  glucose 02/04/2023   Resolved Ambulatory Problems    Diagnosis Date Noted   No Resolved Ambulatory Problems   Past Medical History:  Diagnosis Date   Anxiety    Cluster headaches    Depression    Other specified glaucoma       Observations/Objective: No acute distress Normal breathing Normal mood and appearance   Assessment and Plan: Marland KitchenMarland KitchenMelody was seen today for medication management.  Diagnoses and all orders for this visit:  Age-related osteoporosis without current pathological fracture  Elevated fasting glucose  Elevated ALT measurement  Elevated LDL cholesterol level  CLL (chronic lymphocytic leukemia) (HCC)   Pt declined bone density medication  She will diligently start vitamin D and calcium daily with regular exercise and recheck dexa scan in one year A1C was not added to labs. Will get in October for weight follow up visit.  ALT slightly elevated weight loss could help. Limit or avoid alcohol.  Discussed 4.2 10 year risk. Declined any medication.  CLL managed by hematology oncology.    Follow Up Instructions:    I discussed the assessment and treatment plan with the patient. The patient was provided an opportunity to ask questions and all were answered. The patient agreed with the plan and demonstrated an understanding of the instructions.   The patient was advised to call back or seek an in-person evaluation if the symptoms worsen or if the condition fails to improve as anticipated.     Tandy Gaw, PA-C

## 2023-02-06 ENCOUNTER — Other Ambulatory Visit (HOSPITAL_BASED_OUTPATIENT_CLINIC_OR_DEPARTMENT_OTHER): Payer: Self-pay | Admitting: Obstetrics & Gynecology

## 2023-02-06 DIAGNOSIS — A6004 Herpesviral vulvovaginitis: Secondary | ICD-10-CM

## 2023-02-19 ENCOUNTER — Other Ambulatory Visit: Payer: Self-pay | Admitting: Physician Assistant

## 2023-02-19 DIAGNOSIS — E663 Overweight: Secondary | ICD-10-CM

## 2023-03-09 ENCOUNTER — Ambulatory Visit (HOSPITAL_BASED_OUTPATIENT_CLINIC_OR_DEPARTMENT_OTHER): Payer: Commercial Managed Care - PPO | Admitting: Obstetrics & Gynecology

## 2023-03-09 ENCOUNTER — Ambulatory Visit (HOSPITAL_BASED_OUTPATIENT_CLINIC_OR_DEPARTMENT_OTHER): Payer: Commercial Managed Care - PPO | Admitting: Radiology

## 2023-03-17 ENCOUNTER — Telehealth: Payer: Self-pay | Admitting: Physician Assistant

## 2023-03-17 DIAGNOSIS — F339 Major depressive disorder, recurrent, unspecified: Secondary | ICD-10-CM

## 2023-03-17 NOTE — Telephone Encounter (Signed)
Patient called in wanting a referral to therapist for her "mental", states that her therapist is retired. States she will like to be notified vis MyChart

## 2023-03-18 DIAGNOSIS — F339 Major depressive disorder, recurrent, unspecified: Secondary | ICD-10-CM | POA: Insufficient documentation

## 2023-03-18 NOTE — Telephone Encounter (Signed)
Ok referral placed for [employer] at [employer].

## 2023-03-18 NOTE — Addendum Note (Signed)
Addended by: Jomarie Longs on: 03/18/2023 12:33 PM   Modules accepted: Orders

## 2023-03-24 ENCOUNTER — Telehealth: Payer: Self-pay | Admitting: Physician Assistant

## 2023-03-24 NOTE — Telephone Encounter (Signed)
Patient called in inquiring about a sleep study. Please Advise.

## 2023-03-27 ENCOUNTER — Other Ambulatory Visit: Payer: Self-pay

## 2023-03-27 ENCOUNTER — Inpatient Hospital Stay: Payer: Commercial Managed Care - PPO | Attending: Hematology & Oncology | Admitting: Hematology & Oncology

## 2023-03-27 ENCOUNTER — Other Ambulatory Visit: Payer: Self-pay | Admitting: Physician Assistant

## 2023-03-27 ENCOUNTER — Inpatient Hospital Stay: Payer: Commercial Managed Care - PPO

## 2023-03-27 ENCOUNTER — Encounter: Payer: Self-pay | Admitting: Hematology & Oncology

## 2023-03-27 VITALS — BP 131/72 | HR 53 | Temp 98.5°F | Resp 18 | Ht 65.0 in | Wt 171.0 lb

## 2023-03-27 DIAGNOSIS — Z79899 Other long term (current) drug therapy: Secondary | ICD-10-CM | POA: Diagnosis not present

## 2023-03-27 DIAGNOSIS — C9110 Chronic lymphocytic leukemia of B-cell type not having achieved remission: Secondary | ICD-10-CM

## 2023-03-27 DIAGNOSIS — G478 Other sleep disorders: Secondary | ICD-10-CM

## 2023-03-27 DIAGNOSIS — Z79624 Long term (current) use of inhibitors of nucleotide synthesis: Secondary | ICD-10-CM | POA: Diagnosis not present

## 2023-03-27 DIAGNOSIS — E663 Overweight: Secondary | ICD-10-CM

## 2023-03-27 LAB — CMP (CANCER CENTER ONLY)
ALT: 17 U/L (ref 0–44)
AST: 19 U/L (ref 15–41)
Albumin: 4.7 g/dL (ref 3.5–5.0)
Alkaline Phosphatase: 92 U/L (ref 38–126)
Anion gap: 9 (ref 5–15)
BUN: 17 mg/dL (ref 8–23)
CO2: 28 mmol/L (ref 22–32)
Calcium: 9.3 mg/dL (ref 8.9–10.3)
Chloride: 101 mmol/L (ref 98–111)
Creatinine: 0.87 mg/dL (ref 0.44–1.00)
GFR, Estimated: >60 mL/min (ref >60)
Glucose, Bld: 104 mg/dL — ABNORMAL HIGH (ref 70–99)
Potassium: 4.3 mmol/L (ref 3.5–5.1)
Sodium: 138 mmol/L (ref 135–145)
Total Bilirubin: 0.4 mg/dL (ref 0.3–1.2)
Total Protein: 7.5 g/dL (ref 6.5–8.1)

## 2023-03-27 LAB — CBC WITH DIFFERENTIAL (CANCER CENTER ONLY)
Abs Immature Granulocytes: 0.03 10*3/uL (ref 0.00–0.07)
Basophils Absolute: 0.1 10*3/uL (ref 0.0–0.1)
Basophils Relative: 0 %
Eosinophils Absolute: 0.2 10*3/uL (ref 0.0–0.5)
Eosinophils Relative: 1 %
HCT: 42.3 % (ref 36.0–46.0)
Hemoglobin: 13.6 g/dL (ref 12.0–15.0)
Immature Granulocytes: 0 %
Lymphocytes Relative: 83 %
Lymphs Abs: 19.3 10*3/uL — ABNORMAL HIGH (ref 0.7–4.0)
MCH: 29.2 pg (ref 26.0–34.0)
MCHC: 32.2 g/dL (ref 30.0–36.0)
MCV: 90.8 fL (ref 80.0–100.0)
Monocytes Absolute: 0.5 10*3/uL (ref 0.1–1.0)
Monocytes Relative: 2 %
Neutro Abs: 3.3 10*3/uL (ref 1.7–7.7)
Neutrophils Relative %: 14 %
Platelet Count: 224 10*3/uL (ref 150–400)
RBC: 4.66 MIL/uL (ref 3.87–5.11)
RDW: 13.6 % (ref 11.5–15.5)
Smear Review: NORMAL
WBC Count: 23.4 10*3/uL — ABNORMAL HIGH (ref 4.0–10.5)
nRBC: 0 % (ref 0.0–0.2)

## 2023-03-27 LAB — SAVE SMEAR(SSMR), FOR PROVIDER SLIDE REVIEW

## 2023-03-27 LAB — LACTATE DEHYDROGENASE: LDH: 178 U/L (ref 98–192)

## 2023-03-27 NOTE — Progress Notes (Signed)
Hematology and Oncology Follow Up Visit  Danielle Kim [id number] 04-11-1961 62 y.o. 03/27/2023   Principle Diagnosis:  CLL-stage A  Current Therapy:   Observation     Interim History:  Danielle Kim is back for a long awaited visit.  I think that we last saw her back in 2018.  Since then, she has been doing okay.  I do not think she had any problems with infections.  I do not think she has had any problems with COVID.  She has had no fever.  She has had no swollen lymph nodes.  She has had no cough or shortness of breath.  There is been no issues with weight loss or weight gain.  Her appetite has been doing pretty well.  She has had no change in bowel or bladder habits.  She is up-to-date with her mammogram and colonoscopy.  Overall, I would say that her performance status is probably ECOG 0.  Medications:  Current Outpatient Medications:    Multiple Vitamin (MULTIVITAMIN) tablet, Take 1 tablet by mouth daily., Disp: , Rfl:    pantoprazole (PROTONIX) 40 MG tablet, Take 1 tablet (40 mg total) by mouth daily., Disp: 90 tablet, Rfl: 3   sertraline (ZOLOFT) 50 MG tablet, Take 1.5 tablets (75 mg total) by mouth daily., Disp: 135 tablet, Rfl: 1   timolol (BETIMOL) 0.25 % ophthalmic solution, 1-2 drops 2 (two) times daily., Disp: , Rfl:    TRAVATAN Z 0.004 % SOLN ophthalmic solution, PLACE 1 DROP INTO THE LEFT EYE NIGHTLY, Disp: , Rfl: 5   valACYclovir (VALTREX) 500 MG tablet, TAKE 1 TABLET (500 MG TOTAL) BY MOUTH DAILY., Disp: 90 tablet, Rfl: 0  Allergies: No Known Allergies  Past Medical History, Surgical history, Social history, and Family History were reviewed and updated.  Review of Systems: Review of Systems  Constitutional: Negative.   HENT:  Negative.    Eyes: Negative.   Respiratory: Negative.    Cardiovascular: Negative.   Gastrointestinal: Negative.   Endocrine: Negative.   Genitourinary: Negative.    Musculoskeletal: Negative.   Skin: Negative.   Neurological:  Negative.   Hematological: Negative.   Psychiatric/Behavioral: Negative.      Physical Exam:  height is 5\' 5"  (1.651 m) and weight is 171 lb (77.6 kg). Her oral temperature is 98.5 F (36.9 C). Her blood pressure is 131/72 and her pulse is 53 (abnormal). Her respiration is 18 and oxygen saturation is 99%.   Wt Readings from Last 3 Encounters:  03/27/23 171 lb (77.6 kg)  01/28/23 181 lb (82.1 kg)  01/07/22 174 lb (78.9 kg)    Physical Exam Vitals reviewed.  HENT:     Head: Normocephalic and atraumatic.  Eyes:     Pupils: Pupils are equal, round, and reactive to light.  Cardiovascular:     Rate and Rhythm: Normal rate and regular rhythm.     Heart sounds: Normal heart sounds.  Pulmonary:     Effort: Pulmonary effort is normal.     Breath sounds: Normal breath sounds.  Abdominal:     General: Bowel sounds are normal.     Palpations: Abdomen is soft.  Musculoskeletal:        General: No tenderness or deformity. Normal range of motion.     Cervical back: Normal range of motion.  Lymphadenopathy:     Cervical: No cervical adenopathy.  Skin:    General: Skin is warm and dry.     Findings: No erythema or rash.  Neurological:  Mental Status: She is alert and oriented to person, place, and time.  Psychiatric:        Behavior: Behavior normal.        Thought Content: Thought content normal.        Judgment: Judgment normal.      Lab Results  Component Value Date   WBC 23.4 (H) 03/27/2023   HGB 13.6 03/27/2023   HCT 42.3 03/27/2023   MCV 90.8 03/27/2023   PLT 224 03/27/2023     Chemistry      Component Value Date/Time   NA 138 03/27/2023 1035   NA 142 05/23/2021 0826   NA 146 (H) 06/03/2017 0822   K 4.3 03/27/2023 1035   K 4.6 06/03/2017 0822   CL 101 03/27/2023 1035   CL 102 06/03/2017 0822   CO2 28 03/27/2023 1035   CO2 33 06/03/2017 0822   BUN 17 03/27/2023 1035   BUN 17 05/23/2021 0826   BUN 14 06/03/2017 0822   CREATININE 0.87 03/27/2023 1035    CREATININE 0.85 01/28/2023 0814      Component Value Date/Time   CALCIUM 9.3 03/27/2023 1035   CALCIUM 9.5 06/03/2017 0822   ALKPHOS 92 03/27/2023 1035   ALKPHOS 84 06/03/2017 0822   AST 19 03/27/2023 1035   ALT 17 03/27/2023 1035   ALT 46 06/03/2017 0822   BILITOT 0.4 03/27/2023 1035     Her peripheral blood smear shows a normochromic and normocytic population of red blood cells.  There are no nucleated red blood cells.  I see no teardrop cells.  There is no clumping.  There is no schistocytes or spherocytes.  White blood cells are increased.  The majority are lymphocytes.  There are mature lymphocytes.  Platelets are adequate in number and size.  Impression and Plan: Danielle Kim is a very charming 62 year old white female.  She has a lot of fun to talk to.  Apparently, she knew my wife in high school..  She has CLL.  This was confirmed by flow cytometry back in 2018.  I do not see any indication that she needs to be treated.  Her white cell count may be but yet she is not anemic or thrombocytopenic.  I do not see anything on physical exam that would suggest that she needs to be treated.  I do not see any indication for a bone marrow biopsy.  She does not need any scans..  I still think we can watch her.  I probably would just have her come back in 1 year.  Again, has been 6 years since we last saw her.  Her white cell count has barely doubled in this amount of time.  It was fun talking to her.  Again, we can have her come back in 1 year.  She knows that she can was come back sooner if necessary.     Josph Macho, MD 8/30/20243:52 PM

## 2023-03-27 NOTE — Telephone Encounter (Signed)
Unable to locate orders for sleep study. Please advise.

## 2023-03-28 LAB — BETA 2 MICROGLOBULIN, SERUM: Beta-2 Microglobulin: 1.7 mg/L (ref 0.6–2.4)

## 2023-04-01 NOTE — Telephone Encounter (Signed)
Sleep study orders, clinical notes, demographics, and copies of insurance cards have been faxed to [facility] at [phone]. Patient will received incoming call from [facility] to complete over the phone registration.

## 2023-04-02 LAB — SURGICAL PATHOLOGY

## 2023-04-03 LAB — FLOW CYTOMETRY

## 2023-04-06 ENCOUNTER — Encounter: Payer: Self-pay | Admitting: Professional

## 2023-04-06 ENCOUNTER — Ambulatory Visit (INDEPENDENT_AMBULATORY_CARE_PROVIDER_SITE_OTHER): Payer: Commercial Managed Care - PPO | Admitting: Professional

## 2023-04-06 DIAGNOSIS — F330 Major depressive disorder, recurrent, mild: Secondary | ICD-10-CM | POA: Diagnosis not present
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
## 2023-04-06 NOTE — Progress Notes (Unsigned)
° ° ° ° ° ° ° ° ° ° ° ° ° ° °  Kathy Collins, LCMHC °

## 2023-04-07 ENCOUNTER — Encounter: Payer: Self-pay | Admitting: Sports Medicine

## 2023-04-07 ENCOUNTER — Ambulatory Visit: Payer: Commercial Managed Care - PPO | Admitting: Sports Medicine

## 2023-04-07 ENCOUNTER — Ambulatory Visit (INDEPENDENT_AMBULATORY_CARE_PROVIDER_SITE_OTHER): Payer: Commercial Managed Care - PPO

## 2023-04-07 DIAGNOSIS — M7918 Myalgia, other site: Secondary | ICD-10-CM | POA: Diagnosis not present

## 2023-04-07 DIAGNOSIS — M25551 Pain in right hip: Secondary | ICD-10-CM | POA: Diagnosis not present

## 2023-04-07 DIAGNOSIS — M7541 Impingement syndrome of right shoulder: Secondary | ICD-10-CM

## 2023-04-07 DIAGNOSIS — G8929 Other chronic pain: Secondary | ICD-10-CM

## 2023-04-07 DIAGNOSIS — M545 Low back pain, unspecified: Secondary | ICD-10-CM

## 2023-04-07 MED ORDER — MELOXICAM 15 MG PO TABS
ORAL_TABLET | ORAL | 3 refills | Status: DC
Start: 2023-04-07 — End: 2024-03-08

## 2023-04-07 NOTE — Progress Notes (Signed)
    Procedures performed today:    None.  Independent interpretation of notes and tests performed by another provider:   None.  Brief History, Exam, Impression, and Recommendations:    Left buttock pain This is a very pleasant 62 year old female, about a year ago she recalls doing an incline leg press and then feeling a pop left buttock, it has not improved, on exam she has significant weakness to flexion of the left knee, tenderness at the ischial tuberosity. We will start with x-rays, meloxicam, formal physical therapy, if no better in about 6 weeks we will consider ischial tuberosity injection and MRI.  Of note there is some question as to whether she can take NSAIDs with her glaucoma, I have called her ophthalmologist Dr. Reginia Naas and I am awaiting a callback.  We will print out a prescription and she can fill it once we give her the allCLEAR.  Impingement syndrome of right shoulder Pain over the right shoulder present for months, positive impingement sign's on exam, discussed the anatomy and pathophysiology. Adding x-rays, formal physical therapy, meloxicam, return to see me in 6 weeks, subacromial injection if not better.  Right iliac crest pain Unclear etiology, tenderness to palpation on the right iliac crest, the rest of the exam is normal, this is likely referred from the lumbar spine, adding lumbar spine x-rays, right iliac crest x-rays, NSAIDs, we will watch this for now.  I spent 30 minutes of total time managing this patient today, this includes chart review, face to face, and non-face to face time.  ____________________________________________ Ihor Austin. Benjamin Stain, M.D., ABFM., CAQSM., AME. Primary Care and Sports Medicine [facility]  Adjunct Professor of Family Medicine  [place] [employer]  Restaurant manager, fast food

## 2023-04-07 NOTE — Assessment & Plan Note (Signed)
Pain over the right shoulder present for months, positive impingement sign's on exam, discussed the anatomy and pathophysiology. Adding x-rays, formal physical therapy, meloxicam, return to see me in 6 weeks, subacromial injection if not better.

## 2023-04-07 NOTE — Assessment & Plan Note (Addendum)
This is a very pleasant 62 year old female, about a year ago she recalls doing an incline leg press and then feeling a pop left buttock, it has not improved, on exam she has significant weakness to flexion of the left knee, tenderness at the ischial tuberosity. We will start with x-rays, meloxicam, formal physical therapy, if no better in about 6 weeks we will consider ischial tuberosity injection and MRI.  Of note there is some question as to whether she can take NSAIDs with her glaucoma, I have called her ophthalmologist Dr. Reginia Naas and I am awaiting a callback.  We will print out a prescription and she can fill it once we give her the allCLEAR.

## 2023-04-07 NOTE — Assessment & Plan Note (Signed)
Unclear etiology, tenderness to palpation on the right iliac crest, the rest of the exam is normal, this is likely referred from the lumbar spine, adding lumbar spine x-rays, right iliac crest x-rays, NSAIDs, we will watch this for now.

## 2023-04-10 ENCOUNTER — Telehealth: Payer: Self-pay | Admitting: Sports Medicine

## 2023-04-10 DIAGNOSIS — M7541 Impingement syndrome of right shoulder: Secondary | ICD-10-CM

## 2023-04-10 NOTE — Telephone Encounter (Signed)
Patient called in stating that she wanted to make sure that she did not have any tears. Also, she does not have an order for PT for her shoulder only her bottom and PT needs the order for both.

## 2023-04-10 NOTE — Telephone Encounter (Signed)
Still awaiting the x-ray report, I will place the PT order.

## 2023-04-13 ENCOUNTER — Other Ambulatory Visit: Payer: Self-pay

## 2023-04-13 ENCOUNTER — Ambulatory Visit: Payer: Commercial Managed Care - PPO | Attending: Sports Medicine | Admitting: Physical Therapy

## 2023-04-13 ENCOUNTER — Encounter: Payer: Self-pay | Admitting: Physical Therapy

## 2023-04-13 DIAGNOSIS — M25511 Pain in right shoulder: Secondary | ICD-10-CM | POA: Diagnosis not present

## 2023-04-13 DIAGNOSIS — M25611 Stiffness of right shoulder, not elsewhere classified: Secondary | ICD-10-CM | POA: Insufficient documentation

## 2023-04-13 DIAGNOSIS — M25552 Pain in left hip: Secondary | ICD-10-CM | POA: Insufficient documentation

## 2023-04-13 DIAGNOSIS — M6281 Muscle weakness (generalized): Secondary | ICD-10-CM | POA: Diagnosis not present

## 2023-04-13 DIAGNOSIS — G8929 Other chronic pain: Secondary | ICD-10-CM | POA: Insufficient documentation

## 2023-04-13 DIAGNOSIS — M7918 Myalgia, other site: Secondary | ICD-10-CM | POA: Diagnosis present

## 2023-04-13 NOTE — Therapy (Signed)
OUTPATIENT PHYSICAL THERAPY LOWER EXTREMITY EVALUATION   Patient Name: Danielle Kim MRN: [id number] DOB:[birth year], 62 y.o., female Today's Date: 04/13/2023  END OF SESSION:  PT End of Session - 04/13/23 1626     Visit Number 1    Number of Visits 17    Date for PT Re-Evaluation 06/08/23    Authorization Type [employer] MCR    Authorization Time Period 04/13/23 to 06/08/23    Progress Note Due on Visit 10    PT Start Time 1532    PT Stop Time 1611    PT Time Calculation (min) 39 min    Activity Tolerance Patient tolerated treatment well    Behavior During Therapy [facility] for tasks assessed/performed             Past Medical History:  Diagnosis Date   Anxiety    CLL (chronic lymphocytic leukemia) (HCC)    Cluster headaches    Depression    Other specified glaucoma    Pain around left eye     several bouts  sicne 2011.    Past Surgical History:  Procedure Laterality Date   CERVICAL DISC SURGERY     2001   REDUCTION MAMMAPLASTY     Patient Active Problem List   Diagnosis Date Noted   Left buttock pain 04/07/2023   Impingement syndrome of right shoulder 04/07/2023   Right iliac crest pain 04/07/2023   Depression, recurrent (HCC) 03/18/2023   Elevated LDL cholesterol level 02/04/2023   Elevated ALT measurement 02/04/2023   Elevated fasting glucose 02/04/2023   Osteoporosis 01/28/2023   Sternal pain 02/26/2022   Vitamin D insufficiency 01/10/2022   Overweight (BMI 25.0-29.9) 01/07/2022   Post-menopausal 04/04/2021   Reactive depression 04/04/2021   Herpes simplex vulvovaginitis 04/04/2021   Vertebral compression fracture (HCC) 09/28/2020   Numbness around mouth 09/23/2018   DDD (degenerative disc disease), cervical 08/23/2018   Primary osteoarthritis of first carpometacarpal joint of one hand, right 08/23/2018   CLL (chronic lymphocytic leukemia) (HCC) 09/02/2017   Trigger middle finger of right hand 11/07/2015   Insomnia 03/31/2014   Pain around left eye     Secondary open-angle glaucoma of left eye, severe stage 02/07/2013    PCP: Tandy Gaw PA-C   REFERRING PROVIDER: Monica Becton, MD  REFERRING DIAG:  Diagnosis  M79.18 (ICD-10-CM) - Left buttock pain   Diagnosis  M75.41 (ICD-10-CM) - Impingement syndrome of right shoulder    THERAPY DIAG:  Chronic right shoulder pain  Pain in left hip  Muscle weakness (generalized)  Stiffness of right shoulder, not elsewhere classified  Rationale for Evaluation and Treatment: Rehabilitation  ONSET DATE: Hamstring- January 2023, Shoulder about 6 months ago   SUBJECTIVE:   SUBJECTIVE STATEMENT:  I can't sit for very well for very long due to my buttock, the doctor thinks that I may have a HS tear. He thinks that I might have a rotator cuff issue in the right shoulder too. For the HS, I was on the leg press at a gym in [place] and just felt something go goofy in the left left, saw a PT there and felt better but I guess I re-aggravated it. Shoulder started when sister's dog pulled right shoulder back behind me. Can't goal post and get my right arm back to the well without severe pain.   PERTINENT HISTORY: Anxiety, CLL, cluster HA, depression, cervical disc surgery  PAIN:  Are you having pain?  Buttock can hurt severely in sitting, arm is OK when I'm  not moving it/no number given on NPRS   PRECAUTIONS: None  RED FLAGS: None   WEIGHT BEARING RESTRICTIONS: No  FALLS:  Has patient fallen in last 6 months? 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 Karie Schwalbe 05/20/23  OBJECTIVE:   DIAGNOSTIC FINDINGS: EXAM:  LUMBAR SPINE - COMPLETE 4+ VIEW   COMPARISON:  October 10, 2010   FINDINGS: There is no evidence of lumbar spine fracture. Mild levocurvature of lumbar spine. Mild facet joint sclerosis throughout the lumbar spine. Mild anterior spurring noted at L1, L2 and L3. Intervertebral disc spaces are maintained.   IMPRESSION: Mild degenerative joint changes of lumbar spine.  PATIENT SURVEYS:  FOTO not in system at eval  COGNITION: Overall cognitive status: Within functional limits for tasks assessed     SENSATION: Not tested    MUSCLE LENGTH: Hamstrings moderate limitation B, piriformis moderate limitation B   POSTURE: rounded shoulders, forward head, and increased thoracic kyphosis  PALPATION: Mid muscle belly tender on HS, no tenderness noted glutes or piriformis     R shoulder AROM seated: flexion 145* tightness, ABD 97* painful, FER to base of neck, FIR barely able to reach glute L shoulder AROM WNL   LOWER EXTREMITY MMT:  MMT Right eval Left eval  Hip flexion 5 5  Hip extension 3 3  Hip abduction 5 5  Hip adduction    Hip internal rotation    Hip external rotation    Knee flexion 4 4  Knee extension 5 5  Ankle dorsiflexion 4+ 4+  Ankle plantarflexion    Ankle inversion    Ankle eversion     (Blank rows = not tested)   MMT: R 4+/5 L 4+/5 flexion, ABD 4/5 B, ER 4+/5, IR 4+/5     TODAY'S TREATMENT:                                                                                                                              DATE:   Eval  Objective measures, care planning, education   TherEx  Single leg bridges x10 B HS stretches 1x30 seconds B Piriformis stretches 1x30 seconds B Scapular retractions red TB x10 Shoulder extensions red TB x10     PATIENT EDUCATION:  Education details: exam findings, care planning, appropriate education   Person educated: Patient Education method: Explanation, Demonstration, and Handouts Education comprehension: verbalized understanding, returned demonstration, and needs further education  HOME EXERCISE PROGRAM: Access Code: [email or web address] URL: [email or web address]  Date: 04/13/2023 Prepared by: Nedra Hai  Exercises - Figure 4 Bridge  - 1 x daily - 7 x weekly - 2 sets - 10 reps - Supine Figure 4 Piriformis Stretch  - 2 x daily - 7 x weekly - 1 sets - 3 reps - Hooklying Hamstring Stretch with Strap  - 2 x daily - 7 x weekly - 1 sets - 3 reps - Scapular Retraction with Resistance  - 1 x daily - 7 x weekly - 2 sets - 10 reps - Shoulder extension with resistance - Neutral  - 1 x daily - 7 x weekly - 2 sets - 10 reps  ASSESSMENT:  CLINICAL IMPRESSION: Patient is a 62 y.o. F who was seen today for physical therapy evaluation and treatment for  Diagnosis  M79.18 (ICD-10-CM) - Left buttock pain   M75.41 (ICD-10-CM) - Impingement syndrome of right shoulder .  Exam with objective impairments as above. Will benefit from skilled PT services to address all objective findings/subjective concerns and optimize overall level of function.    OBJECTIVE IMPAIRMENTS: Abnormal gait, decreased mobility, difficulty walking, decreased ROM, decreased strength, increased fascial restrictions, increased muscle spasms, impaired flexibility, improper body mechanics, postural dysfunction, and pain.   ACTIVITY LIMITATIONS: carrying, lifting, sitting, standing, toileting, dressing, reach over head, and hygiene/grooming  PARTICIPATION LIMITATIONS: meal prep, cleaning, laundry, driving, shopping, community activity, occupation, and yard work  PERSONAL FACTORS: Time since onset of injury/illness/exacerbation are also affecting patient's functional outcome.   REHAB POTENTIAL: Good  CLINICAL DECISION MAKING: Stable/uncomplicated  EVALUATION COMPLEXITY: Low   GOALS: Goals reviewed with patient?  No  SHORT TERM GOALS: Target date: 05/11/2023   Will be compliant with appropriate progressive HEP Baseline: Goal status: INITIAL  2.  Pain in L buttock with sitting tasks of 2 hours duration to have improved by at least 50%  Baseline:  Goal status: INITIAL  3.  Pain in R shoulder with movement to have improved by at least 50% Baseline:  Goal status: INITIAL  4.  R shoulder AROM to match that of L shoulder AROM  Baseline:  Goal status: INITIAL   LONG TERM GOALS: Target date: 06/08/2023    MMT to have improved by one grade in all weak groups  Baseline:  Goal status: INITIAL  2.  Pain in R shoulder and L buttock to be no more than 2/10 at worst with functional task performance  Baseline:  Goal status: INITIAL  3.  Will be able to sit for unlimited amounts of time for work and travel with buttock pain no more than 2/10 Baseline:  Goal status: INITIAL  4.  Will be able to perform all functional hygiene/ADL and house work based tasks without increase in pain R shoulder  Baseline:  Goal status: INITIAL   PLAN:  PT FREQUENCY: 1-2x/week  PT DURATION: 8 weeks  PLANNED INTERVENTIONS: Therapeutic exercises, Therapeutic activity, Gait training, Patient/Family education, Self Care, Joint mobilization, Aquatic Therapy, Dry Needling, Electrical stimulation, Spinal mobilization, Cryotherapy, Moist heat, Taping, Ultrasound, Ionotophoresis 4mg /ml Dexamethasone, Manual therapy, and Re-evaluation  PLAN FOR NEXT SESSION: hip/core strength and LE flexibility, shoulder ROM and strength/postural training   Nedra Hai, PT, DPT 04/13/23 4:27 PM

## 2023-04-14 ENCOUNTER — Other Ambulatory Visit (HOSPITAL_BASED_OUTPATIENT_CLINIC_OR_DEPARTMENT_OTHER): Payer: Self-pay | Admitting: Obstetrics & Gynecology

## 2023-04-14 ENCOUNTER — Ambulatory Visit (INDEPENDENT_AMBULATORY_CARE_PROVIDER_SITE_OTHER): Payer: Commercial Managed Care - PPO | Admitting: Professional

## 2023-04-14 ENCOUNTER — Encounter: Payer: Self-pay | Admitting: Professional

## 2023-04-14 ENCOUNTER — Telehealth: Payer: Self-pay | Admitting: Sports Medicine

## 2023-04-14 DIAGNOSIS — F330 Major depressive disorder, recurrent, mild: Secondary | ICD-10-CM | POA: Diagnosis not present

## 2023-04-14 DIAGNOSIS — F329 Major depressive disorder, single episode, unspecified: Secondary | ICD-10-CM
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
## 2023-04-14 NOTE — Telephone Encounter (Signed)
Letter written and in my chart.

## 2023-04-14 NOTE — Progress Notes (Unsigned)
Danielle Kim, [facility]

## 2023-04-14 NOTE — Telephone Encounter (Signed)
patient is requesting a doctors note for a return to work date of 9/30

## 2023-04-16 ENCOUNTER — Ambulatory Visit: Payer: Commercial Managed Care - PPO | Admitting: Rehabilitative and Restorative Service Providers"

## 2023-04-16 ENCOUNTER — Encounter: Payer: Self-pay | Admitting: Rehabilitative and Restorative Service Providers"

## 2023-04-16 DIAGNOSIS — G8929 Other chronic pain: Secondary | ICD-10-CM

## 2023-04-16 DIAGNOSIS — M6281 Muscle weakness (generalized): Secondary | ICD-10-CM

## 2023-04-16 DIAGNOSIS — M25611 Stiffness of right shoulder, not elsewhere classified: Secondary | ICD-10-CM

## 2023-04-16 DIAGNOSIS — M25511 Pain in right shoulder: Secondary | ICD-10-CM | POA: Diagnosis not present

## 2023-04-16 DIAGNOSIS — M25552 Pain in left hip: Secondary | ICD-10-CM

## 2023-04-16 NOTE — Therapy (Addendum)
 OUTPATIENT PHYSICAL THERAPY LOWER EXTREMITY TREATMENT PHYSICAL THERAPY DISCHARGE SUMMARY  Visits from Start of Care: 2  Current functional level related to goals / functional outcomes: See progress note for discharge status    Remaining deficits: Unknown    Education / Equipment: HEP    Patient agrees to discharge. Patient goals were not met. Patient is being discharged due to not returning since the last visit.  Oley Lahaie P. Leonor Liv PT, MPH 10/06/23 2:30 PM    Patient Name: Danielle Kim MRN: [id number] DOB:1961-07-16, 62 y.o., female Today's Date: 04/16/2023  END OF SESSION:  PT End of Session - 04/16/23 1619     Visit Number 2    Number of Visits 17    Date for PT Re-Evaluation 06/08/23    Authorization Type [employer] MCR    Authorization Time Period 04/13/23 to 06/08/23    Progress Note Due on Visit 10    PT Start Time 1617    PT Stop Time 1704    PT Time Calculation (min) 47 min    Activity Tolerance Patient tolerated treatment well             Past Medical History:  Diagnosis Date   Anxiety    CLL (chronic lymphocytic leukemia) (HCC)    Cluster headaches    Depression    Other specified glaucoma    Pain around left eye     several bouts  sicne 2011.    Past Surgical History:  Procedure Laterality Date   CERVICAL DISC SURGERY     2001   REDUCTION MAMMAPLASTY     Patient Active Problem List   Diagnosis Date Noted   Left buttock pain 04/07/2023   Impingement syndrome of right shoulder 04/07/2023   Right iliac crest pain 04/07/2023   Depression, recurrent (HCC) 03/18/2023   Elevated LDL cholesterol level 02/04/2023   Elevated ALT measurement 02/04/2023   Elevated fasting glucose 02/04/2023   Osteoporosis 01/28/2023   Sternal pain 02/26/2022   Vitamin D insufficiency 01/10/2022   Overweight (BMI 25.0-29.9) 01/07/2022   Post-menopausal 04/04/2021   Reactive depression 04/04/2021   Herpes simplex vulvovaginitis 04/04/2021   Vertebral compression  fracture (HCC) 09/28/2020   Numbness around mouth 09/23/2018   DDD (degenerative disc disease), cervical 08/23/2018   Primary osteoarthritis of first carpometacarpal joint of one hand, right 08/23/2018   CLL (chronic lymphocytic leukemia) (HCC) 09/02/2017   Trigger middle finger of right hand 11/07/2015   Insomnia 03/31/2014   Pain around left eye    Secondary open-angle glaucoma of left eye, severe stage 02/07/2013    PCP: Tandy Gaw PA-C   REFERRING PROVIDER: Monica Becton, MD  REFERRING DIAG:  Diagnosis  M79.18 (ICD-10-CM) - Left buttock pain   Diagnosis  M75.41 (ICD-10-CM) - Impingement syndrome of right shoulder    THERAPY DIAG:  Chronic right shoulder pain  Pain in left hip  Muscle weakness (generalized)  Stiffness of right shoulder, not elsewhere classified  Rationale for Evaluation and Treatment: Rehabilitation  ONSET DATE: Hamstring- January 2023, Shoulder about 6 months ago   SUBJECTIVE:   SUBJECTIVE STATEMENT: Has been doing her exercises twice a day. L buttock only hurts when she is sitting or standing, not when moving. R shoulder hurts when reaching overhead or behind her back.    EVAL: I can't sit for very well for very long due to my buttock, the doctor thinks that I may have a HS tear. He thinks that I might have a rotator cuff issue in  the right shoulder too. For the HS, I was on the leg press at a gym in [place] and just felt something go goofy in the left left, saw a PT there and felt better but I guess I re-aggravated it. Shoulder started when sister's dog pulled right shoulder back behind me. Can't goal post and get my right arm back to the well without severe pain.   PERTINENT HISTORY: Anxiety, CLL, cluster HA, depression, cervical disc surgery  PAIN:  Are you having pain? Pain in the sitting or standing in L Buttock 3/10;  Right 1/10 no bad worse with reaching overhead or behind her     PRECAUTIONS: None   WEIGHT BEARING  RESTRICTIONS: No  FALLS:  Has patient fallen in last 6 months? 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 Karie Schwalbe 05/20/23  OBJECTIVE:   DIAGNOSTIC FINDINGS: EXAM:  LUMBAR SPINE - COMPLETE 4+ VIEW   COMPARISON:  October 10, 2010   FINDINGS: There is no evidence of lumbar spine fracture. Mild levocurvature of lumbar spine. Mild facet joint sclerosis throughout the lumbar spine. Mild anterior spurring noted at L1, L2 and L3. Intervertebral disc spaces are maintained.   IMPRESSION: Mild degenerative joint changes of lumbar spine.  PATIENT SURVEYS:  FOTO not in system at eval   SENSATION: Not tested   MUSCLE LENGTH: Hamstrings moderate limitation B, piriformis moderate limitation B   POSTURE: rounded shoulders, forward head, and increased thoracic kyphosis  PALPATION: Mid muscle belly tender on HS, no tenderness noted glutes or piriformis   04/16/23: palpable tightness in the posterior L hip through the piriformis and gluts and R anterior shoulder through pecs and biceps.   R shoulder AROM seated: flexion 145* tightness, ABD 97* painful, FER to base of neck, FIR barely able to reach glute L shoulder AROM WNL   LOWER EXTREMITY MMT:  MMT Right eval Left eval  Hip flexion 5 5  Hip extension 3 3  Hip abduction 5 5  Hip adduction    Hip internal rotation    Hip external rotation    Knee flexion 4 4  Knee extension 5 5  Ankle dorsiflexion 4+ 4+  Ankle plantarflexion    Ankle inversion    Ankle eversion     (Blank rows = not tested)   MMT: R 4+/5 L 4+/5 flexion, ABD 4/5 B, ER 4+/5, IR  4+/5     [facility] Adult PT Treatment:                                                DATE: 04/16/23 Therapeutic Exercise: Supine  Piriformis stretch travell 30 sec x 3  Diagonal knee to chest 30 sec x 2 Hamstring stretch with strap 30 sec x 2  Sitting  Biceps stretch 30 sec x 3  Standing  Doorway stretch 3 positions 30 sec x 3 to patient tolerance  Row red TB 3 sec x 10 Shoulder extension from above  head level red TB 3 sec x 10  Modalities: Discussed use of heat and ice at home    EVAL;  Single leg bridges x10 B HS stretches 1x30 seconds B Piriformis stretches 1x30 seconds B Scapular retractions red TB x10 Shoulder extensions red TB x10    PATIENT EDUCATION:  Education details: exam findings, care planning, appropriate education  Person educated: Patient Education method: Explanation, Demonstration, and Handouts Education comprehension: verbalized understanding, returned demonstration, and needs further education  HOME EXERCISE PROGRAM: Access Code: [email or web address] URL: [email or web address] Date: 04/16/2023 Prepared by: Corlis Leak  Exercises - Figure 4 Bridge  - 1 x daily - 7 x weekly - 2 sets - 10 reps - Supine Figure 4 Piriformis Stretch  - 2 x daily - 7 x weekly - 1 sets - 3 reps - Hooklying Hamstring Stretch with Strap  - 2 x daily - 7 x weekly - 1 sets - 3 reps - Scapular Retraction with Resistance  - 1 x daily - 7 x weekly - 2 sets - 10 reps - Shoulder extension with resistance - Neutral  - 1 x daily - 7 x weekly - 2 sets - 10 reps - Supine Piriformis Stretch with Leg Straight  - 2 x daily - 7 x weekly - 1 sets - 3 reps - 30 sec  hold - Hooklying Isometric Clamshell  - 2 x daily - 7 x weekly - 1 sets - 10 reps - 3 sec  hold - Anterior Shoulder and Biceps Stretch  - 2 x daily - 7 x weekly - 1 sets - 3 reps - 30 sec  hold - Doorway Pec Stretch at 60 Degrees Abduction  - 3 x daily - 7 x weekly - 1 sets - 3 reps - Doorway Pec Stretch at 90 Degrees Abduction  -  3 x daily - 7 x weekly - 1 sets - 3 reps - 30 seconds  hold - Doorway Pec Stretch at 120 Degrees Abduction  - 3 x daily - 7 x weekly - 1 sets - 3 reps - 30 second hold  hold  ASSESSMENT:  CLINICAL IMPRESSION: Patient reports that she has been consistent with HEP. Symptoms are unchanged. She has palpable tightness in the posterior L hip through the piriformis and gluts and R anterior shoulder through pecs and biceps. Address tightness with stretching and strengthening. Tolerated exercises well     EVAL: Patient is a 62 y.o. F who was seen today for physical therapy evaluation and treatment for  Diagnosis  M79.18 (ICD-10-CM) - Left buttock pain   M75.41 (ICD-10-CM) - Impingement syndrome of right shoulder .  Exam with objective impairments as above. Will benefit from skilled PT services to address all objective findings/subjective concerns and optimize overall level of function.    OBJECTIVE IMPAIRMENTS: Abnormal gait, decreased mobility, difficulty walking, decreased ROM, decreased strength, increased fascial restrictions, increased muscle spasms, impaired flexibility, improper body mechanics, postural dysfunction, and pain.    GOALS: Goals reviewed with patient? No  SHORT TERM GOALS: Target date: 05/11/2023   Will be compliant with appropriate progressive HEP Baseline: Goal status: INITIAL  2.  Pain in L buttock with sitting tasks of 2 hours duration to have improved by at least 50%  Baseline:  Goal status: INITIAL  3.  Pain in R shoulder with movement to have improved by at least 50% Baseline:  Goal status: INITIAL  4.  R shoulder AROM to match that of L shoulder AROM  Baseline:  Goal status:  INITIAL   LONG TERM GOALS: Target date: 06/08/2023    MMT to have improved by one grade in all weak groups  Baseline:  Goal status: INITIAL  2.  Pain in R shoulder and L buttock to be no more than 2/10 at worst with functional task performance  Baseline:  Goal status:  INITIAL  3.  Will be able to sit for unlimited amounts of time for work and travel with buttock pain no more than 2/10 Baseline:  Goal status: INITIAL  4.  Will be able to perform all functional hygiene/ADL and house work based tasks without increase in pain R shoulder  Baseline:  Goal status: INITIAL   PLAN:  PT FREQUENCY: 1-2x/week  PT DURATION: 8 weeks  PLANNED INTERVENTIONS: Therapeutic exercises, Therapeutic activity, Gait training, Patient/Family education, Self Care, Joint mobilization, Aquatic Therapy, Dry Needling, Electrical stimulation, Spinal mobilization, Cryotherapy, Moist heat, Taping, Ultrasound, Ionotophoresis 4mg /ml Dexamethasone, Manual therapy, and Re-evaluation  PLAN FOR NEXT SESSION: hip/core strength and LE flexibility, shoulder ROM and strength/postural training   Tzion Wedel P. Leonor Liv PT, MPH 04/16/23 5:10 PM

## 2023-04-29 ENCOUNTER — Ambulatory Visit: Payer: Commercial Managed Care - PPO | Admitting: Physician Assistant

## 2023-05-04 ENCOUNTER — Ambulatory Visit: Payer: Commercial Managed Care - PPO | Admitting: Rehabilitative and Restorative Service Providers"

## 2023-05-04 ENCOUNTER — Ambulatory Visit: Payer: Commercial Managed Care - PPO | Admitting: Professional

## 2023-05-20 ENCOUNTER — Ambulatory Visit: Payer: Commercial Managed Care - PPO | Admitting: Sports Medicine

## 2023-06-19 ENCOUNTER — Encounter (HOSPITAL_BASED_OUTPATIENT_CLINIC_OR_DEPARTMENT_OTHER): Payer: Self-pay | Admitting: Obstetrics & Gynecology

## 2023-06-30 ENCOUNTER — Encounter (HOSPITAL_BASED_OUTPATIENT_CLINIC_OR_DEPARTMENT_OTHER): Payer: Self-pay | Admitting: Obstetrics & Gynecology

## 2023-06-30 ENCOUNTER — Ambulatory Visit (HOSPITAL_BASED_OUTPATIENT_CLINIC_OR_DEPARTMENT_OTHER): Payer: 59 | Admitting: Obstetrics & Gynecology

## 2023-06-30 ENCOUNTER — Other Ambulatory Visit (HOSPITAL_COMMUNITY)
Admission: RE | Admit: 2023-06-30 | Discharge: 2023-06-30 | Disposition: A | Discharge status: Home / Self Care | Payer: 59 | Source: Ambulatory Visit | Attending: Obstetrics & Gynecology | Admitting: Obstetrics & Gynecology

## 2023-06-30 VITALS — BP 142/77 | HR 64 | Ht 65.0 in | Wt 177.4 lb

## 2023-06-30 DIAGNOSIS — R232 Flushing: Secondary | ICD-10-CM

## 2023-06-30 DIAGNOSIS — N3946 Mixed incontinence: Secondary | ICD-10-CM

## 2023-06-30 DIAGNOSIS — Z8659 Personal history of other mental and behavioral disorders: Secondary | ICD-10-CM | POA: Diagnosis not present

## 2023-06-30 DIAGNOSIS — Z124 Encounter for screening for malignant neoplasm of cervix: Secondary | ICD-10-CM | POA: Insufficient documentation

## 2023-06-30 DIAGNOSIS — A6004 Herpesviral vulvovaginitis: Secondary | ICD-10-CM

## 2023-06-30 DIAGNOSIS — Z01419 Encounter for gynecological examination (general) (routine) without abnormal findings: Secondary | ICD-10-CM | POA: Diagnosis not present

## 2023-06-30 DIAGNOSIS — F32A Depression, unspecified: Secondary | ICD-10-CM

## 2023-06-30 MED ORDER — SERTRALINE HCL 50 MG PO TABS
75.0000 mg | ORAL_TABLET | Freq: Every day | ORAL | 3 refills | Status: DC
Start: 2023-06-30 — End: 2024-03-08

## 2023-06-30 MED ORDER — GABAPENTIN 100 MG PO CAPS
ORAL_CAPSULE | ORAL | 0 refills | Status: DC
Start: 2023-06-30 — End: 2024-03-08

## 2023-06-30 MED ORDER — VALACYCLOVIR HCL 500 MG PO TABS
500.0000 mg | ORAL_TABLET | Freq: Every day | ORAL | 3 refills | Status: DC
Start: 2023-06-30 — End: 2024-03-08

## 2023-06-30 NOTE — Progress Notes (Signed)
62 y.o. G3P3 Divorced White or Caucasian female here for annual exam.  Reports she is doing well.  Retired this fall.  This has been good.    Has dated a little bit.  Hasn't met anyone really special.    Denies vaginal bleeding.  Having night sweats.    Has CLL and followed by Dr. Myna Hidalgo.    Having some worsening issues with urinary incontinence.  Has urgency and then will have some leaking and can't stop the stream.    Patient's last menstrual period was 04/28/2015.          Sexually active: No.  The current method of family planning is post menopausal status.      Health Maintenance: Pap: 09/13/2019 History of abnormal Pap:  remote hx and cryo therapy at age 9 MMG:  01/26/2023 Colonoscopy:  done 01/2023 BMD:   01/2023, t score -2.5 Screening Labs: 02/2023   reports that she has quit smoking. Her smoking use included cigarettes. She has never used smokeless tobacco. She reports current alcohol use of about 2.0 standard drinks of alcohol per week. She reports that she does not use drugs.  Past Medical History:  Diagnosis Date   Anxiety    CLL (chronic lymphocytic leukemia) (HCC)    Cluster headaches    Depression    Other specified glaucoma    Pain around left eye     several bouts  sicne 2011.     Past Surgical History:  Procedure Laterality Date   CERVICAL DISC SURGERY     2001   REDUCTION MAMMAPLASTY      Current Outpatient Medications  Medication Sig Dispense Refill   meloxicam (MOBIC) 15 MG tablet One tab PO every 24 hours with a meal for 2 weeks, then once every 24 hours prn pain. 30 tablet 3   Multiple Vitamin (MULTIVITAMIN) tablet Take 1 tablet by mouth daily.     pantoprazole (PROTONIX) 40 MG tablet Take 1 tablet (40 mg total) by mouth daily. 90 tablet 3   sertraline (ZOLOFT) 50 MG tablet TAKE 1 AND 1/2 TABLETS BY MOUTH DAILY 135 tablet 0   timolol (BETIMOL) 0.25 % ophthalmic solution 1-2 drops 2 (two) times daily.     TRAVATAN Z 0.004 % SOLN ophthalmic solution  PLACE 1 DROP INTO THE LEFT EYE NIGHTLY  5   valACYclovir (VALTREX) 500 MG tablet TAKE 1 TABLET (500 MG TOTAL) BY MOUTH DAILY. 90 tablet 0   No current facility-administered medications for this visit.    Family History  Problem Relation Age of Onset   Diabetes Father    Stroke Mother    Heart attack Mother    Stomach cancer Maternal Aunt    Down syndrome Sister    Colon cancer Neg Hx     ROS: Constitutional: negative Genitourinary:negative  Exam:   BP (!) 142/77 (BP Location: Right Arm, Patient Position: Sitting, Cuff Size: Normal)   Pulse 64   Ht 5\' 5"  (1.651 m)   Wt 177 lb 6.4 oz (80.5 kg)   LMP 04/28/2015   BMI 29.52 kg/m   Height: 5\' 5"  (165.1 cm)  General appearance: alert, cooperative and appears stated age Head: Normocephalic, without obvious abnormality, atraumatic Neck: no adenopathy, supple, symmetrical, trachea midline and thyroid normal to inspection and palpation Lungs: clear to auscultation bilaterally Breasts: normal appearance, no masses or tenderness Heart: regular rate and rhythm Abdomen: soft, non-tender; bowel sounds normal; no masses,  no organomegaly Extremities: extremities normal, atraumatic, no cyanosis or edema  Skin: Skin color, texture, turgor normal. No rashes or lesions Lymph nodes: Cervical, supraclavicular, and axillary nodes normal. No abnormal inguinal nodes palpated Neurologic: Grossly normal   Pelvic: External genitalia:  no lesions              Urethra:  normal appearing urethra with no masses, tenderness or lesions              Bartholins and Skenes: normal                 Vagina: normal appearing vagina with normal color and no discharge, no lesions              Cervix: no lesions              Pap taken: Yes.   Bimanual Exam:  Uterus:  normal size, contour, position, consistency, mobility, non-tender              Adnexa: normal adnexa and no mass, fullness, tenderness               Rectovaginal: Confirms               Anus:   normal sphincter tone, no lesions  Chaperone, Ina Homes, CMA, was present for exam.  Assessment/Plan: 1. Well woman exam with routine gynecological exam - Pap smear with HR HPV obtained today. - Mammogram 01/26/2023 - Colonoscopy 01/2023 - Bone mineral density 01/2023 - lab work done with PCP, Tandy Gaw - vaccines reviewed/updated  2. Cervical cancer screening - Cytology - PAP( [facility])  3.History of depression - sertraline (ZOLOFT) 50 MG tablet; Take 1.5 tablets (75 mg total) by mouth daily.  Dispense: 135 tablet; Refill: 3  4. Mixed stress and urge urinary incontinence - Ambulatory referral to Physical Therapy  5. Herpes simplex vulvovaginitis - valACYclovir (VALTREX) 500 MG tablet; Take 1 tablet (500 mg total) by mouth daily.  Dispense: 90 tablet; Refill: 3  6. Hot flashes - options discussed for treatment.  Will start gabapentin.  She will give update in about 2 weeks. - gabapentin (NEURONTIN) 100 MG capsule; Take 1 capsule nightly x 3 nights.  Then can increase to 2 capsules nightly x 3 nights and then can increase to 3 capsules  Dispense: 60 capsule; Refill: 0

## 2023-06-30 NOTE — Patient Instructions (Signed)
Estroven complete  Consider gabapentin at night.  You will need a prescription for this.

## 2023-07-02 ENCOUNTER — Ambulatory Visit (HOSPITAL_BASED_OUTPATIENT_CLINIC_OR_DEPARTMENT_OTHER): Payer: Commercial Managed Care - PPO | Admitting: Obstetrics & Gynecology

## 2023-07-02 LAB — CYTOLOGY - PAP
Comment: NEGATIVE
Diagnosis: NEGATIVE
High risk HPV: NEGATIVE

## 2023-10-13 ENCOUNTER — Ambulatory Visit: Payer: Self-pay | Admitting: Physical Therapy

## 2023-10-27 ENCOUNTER — Ambulatory Visit: Payer: Self-pay | Admitting: Physical Therapy

## 2023-11-10 ENCOUNTER — Encounter: Payer: 59 | Admitting: Physical Therapy

## 2023-11-11 DIAGNOSIS — G43109 Migraine with aura, not intractable, without status migrainosus: Secondary | ICD-10-CM | POA: Diagnosis not present

## 2023-11-24 ENCOUNTER — Encounter: Payer: 59 | Admitting: Physical Therapy

## 2024-02-10 DIAGNOSIS — H4032X3 Glaucoma secondary to eye trauma, left eye, severe stage: Secondary | ICD-10-CM | POA: Diagnosis not present

## 2024-02-10 DIAGNOSIS — H4052X3 Glaucoma secondary to other eye disorders, left eye, severe stage: Secondary | ICD-10-CM | POA: Diagnosis not present

## 2024-02-15 ENCOUNTER — Telehealth: Payer: Self-pay | Admitting: Physician Assistant

## 2024-02-15 NOTE — Telephone Encounter (Signed)
 error

## 2024-03-08 ENCOUNTER — Ambulatory Visit (INDEPENDENT_AMBULATORY_CARE_PROVIDER_SITE_OTHER): Payer: Self-pay | Admitting: Physician Assistant

## 2024-03-08 ENCOUNTER — Encounter: Payer: Self-pay | Admitting: Physician Assistant

## 2024-03-08 VITALS — BP 122/80 | HR 62 | Ht 65.0 in | Wt 165.0 lb

## 2024-03-08 DIAGNOSIS — E7800 Pure hypercholesterolemia, unspecified: Secondary | ICD-10-CM

## 2024-03-08 DIAGNOSIS — Z0000 Encounter for general adult medical examination without abnormal findings: Secondary | ICD-10-CM | POA: Diagnosis not present

## 2024-03-08 DIAGNOSIS — R7301 Impaired fasting glucose: Secondary | ICD-10-CM | POA: Diagnosis not present

## 2024-03-08 DIAGNOSIS — M2550 Pain in unspecified joint: Secondary | ICD-10-CM | POA: Insufficient documentation

## 2024-03-08 DIAGNOSIS — Z780 Asymptomatic menopausal state: Secondary | ICD-10-CM

## 2024-03-08 DIAGNOSIS — Z8249 Family history of ischemic heart disease and other diseases of the circulatory system: Secondary | ICD-10-CM

## 2024-03-08 DIAGNOSIS — Z1329 Encounter for screening for other suspected endocrine disorder: Secondary | ICD-10-CM

## 2024-03-08 DIAGNOSIS — A6004 Herpesviral vulvovaginitis: Secondary | ICD-10-CM

## 2024-03-08 DIAGNOSIS — E559 Vitamin D deficiency, unspecified: Secondary | ICD-10-CM

## 2024-03-08 DIAGNOSIS — C9110 Chronic lymphocytic leukemia of B-cell type not having achieved remission: Secondary | ICD-10-CM | POA: Diagnosis not present

## 2024-03-08 DIAGNOSIS — N951 Menopausal and female climacteric states: Secondary | ICD-10-CM

## 2024-03-08 MED ORDER — VALACYCLOVIR HCL 500 MG PO TABS: 500.0000 mg | ORAL_TABLET | Freq: Every day | ORAL | 3 refills | Status: DC

## 2024-03-08 MED ORDER — MELOXICAM 15 MG PO TABS: ORAL_TABLET | ORAL | 1 refills | Status: AC

## 2024-03-08 NOTE — Patient Instructions (Signed)

## 2024-03-08 NOTE — Progress Notes (Signed)
 Complete physical exam  Patient: Danielle Kim   DOB: Feb 12, 1961   63 y.o. Female  MRN: [id number]  Subjective:    Chief Complaint  Patient presents with   Annual Exam    Danielle Kim is a 63 y.o. female who presents today for a complete physical exam. She reports consuming a general diet. She is very active and plays pickleball and walks daily. She generally feels well. She reports sleeping well. She does not have additional problems to discuss today.    Most recent fall risk assessment:    03/08/2024    9:52 AM  Fall Risk   Falls in the past year? 0  Number falls in past yr: 0  Injury with Fall? 0  Risk for fall due to : No Fall Risks     Most recent depression screenings:    03/08/2024    9:53 AM 03/08/2024    9:12 AM  PHQ 2/9 Scores  PHQ - 2 Score 0 0  PHQ- 9 Score  0    Vision:Within last year and Dental: No current dental problems and Receives regular dental care  Patient Active Problem List   Diagnosis Date Noted   Arthralgia 03/08/2024   History of depression 06/30/2023   Left buttock pain 04/07/2023   Impingement syndrome of right shoulder 04/07/2023   Right iliac crest pain 04/07/2023   Depression, recurrent (HCC) 03/18/2023   Elevated LDL cholesterol level 02/04/2023   Elevated ALT measurement 02/04/2023   Elevated fasting glucose 02/04/2023   Osteoporosis 01/28/2023   Vitamin D  insufficiency 01/10/2022   Overweight (BMI 25.0-29.9) 01/07/2022   Post-menopausal 04/04/2021   Herpes simplex vulvovaginitis 04/04/2021   Vertebral compression fracture (HCC) 09/28/2020   DDD (degenerative disc disease), cervical 08/23/2018   Primary osteoarthritis of first carpometacarpal joint of one hand, right 08/23/2018   CLL (chronic lymphocytic leukemia) (HCC) 09/02/2017   Trigger middle finger of right hand 11/07/2015   Secondary open-angle glaucoma of left eye, severe stage 02/07/2013   Past Medical History:  Diagnosis Date   Anxiety    CLL (chronic  lymphocytic leukemia) (HCC)    Cluster headaches    Depression    Other specified glaucoma    Pain around left eye     several bouts  sicne 2011.    Past Surgical History:  Procedure Laterality Date   CERVICAL DISC SURGERY     2001   REDUCTION MAMMAPLASTY     Family History  Problem Relation Age of Onset   Diabetes Father    Stroke Mother    Heart attack Mother    Stomach cancer Maternal Aunt    Down syndrome Sister    Colon cancer Neg Hx    No Known Allergies    Patient Care Team: Macala Baldonado L, PA-C as PCP - General (Family Medicine)   Outpatient Medications Prior to Visit  Medication Sig   latanoprost (XALATAN) 0.005 % ophthalmic solution Place 1 drop into both eyes at bedtime.   Multiple Vitamin (MULTIVITAMIN) tablet Take 1 tablet by mouth daily.   [DISCONTINUED] pantoprazole  (PROTONIX ) 40 MG tablet Take 1 tablet (40 mg total) by mouth daily.   [DISCONTINUED] valACYclovir  (VALTREX ) 500 MG tablet Take 1 tablet (500 mg total) by mouth daily.   [DISCONTINUED] gabapentin  (NEURONTIN ) 100 MG capsule Take 1 capsule nightly x 3 nights.  Then can increase to 2 capsules nightly x 3 nights and then can increase to 3 capsules (Patient not taking: Reported on 03/08/2024)   [  DISCONTINUED] meloxicam  (MOBIC ) 15 MG tablet One tab PO every 24 hours with a meal for 2 weeks, then once every 24 hours prn pain. (Patient not taking: Reported on 03/08/2024)   [DISCONTINUED] sertraline  (ZOLOFT ) 50 MG tablet Take 1.5 tablets (75 mg total) by mouth daily. (Patient not taking: Reported on 03/08/2024)   [DISCONTINUED] timolol (BETIMOL) 0.25 % ophthalmic solution 1-2 drops 2 (two) times daily. (Patient not taking: Reported on 03/08/2024)   [DISCONTINUED] TRAVATAN Z 0.004 % SOLN ophthalmic solution PLACE 1 DROP INTO THE LEFT EYE NIGHTLY (Patient not taking: Reported on 03/08/2024)   No facility-administered medications prior to visit.    Review of Systems  All other systems reviewed and are  negative.         Objective:     BP 122/80   Pulse 62   Ht 5' 5 (1.651 m)   Wt 165 lb (74.8 kg)   LMP 04/28/2015   SpO2 99%   BMI 27.46 kg/m  BP Readings from Last 3 Encounters:  03/08/24 122/80  06/30/23 (!) 142/77  03/27/23 131/72   Wt Readings from Last 3 Encounters:  03/08/24 165 lb (74.8 kg)  06/30/23 177 lb 6.4 oz (80.5 kg)  03/27/23 171 lb (77.6 kg)      Physical Exam  BP 122/80   Pulse 62   Ht 5' 5 (1.651 m)   Wt 165 lb (74.8 kg)   LMP 04/28/2015   SpO2 99%   BMI 27.46 kg/m   General Appearance:    Alert, cooperative, no distress, appears stated age  Head:    Normocephalic, without obvious abnormality, atraumatic  Eyes:    PERRL, conjunctiva/corneas clear, EOM's intact, fundi    benign, both eyes  Ears:    Normal TM's and external ear canals, both ears  Nose:   Nares normal, septum midline, mucosa normal, no drainage    or sinus tenderness  Throat:   Lips, mucosa, and tongue normal; teeth and gums normal  Neck:   Supple, symmetrical, trachea midline, no adenopathy;    thyroid:  no enlargement/tenderness/nodules; no carotid   bruit or JVD  Back:     Symmetric, no curvature, ROM normal, no CVA tenderness  Lungs:     Clear to auscultation bilaterally, respirations unlabored  Chest Wall:    No tenderness or deformity   Heart:    Regular rate and rhythm, S1 and S2 normal, no murmur, rub   or gallop     Abdomen:     Soft, non-tender, bowel sounds active all four quadrants,    no masses, no organomegaly        Extremities:   Extremities normal, atraumatic, no cyanosis or edema  Pulses:   2+ and symmetric all extremities  Skin:   Skin color, texture, turgor normal, no rashes or lesions  Lymph nodes:   Cervical, supraclavicular, and axillary nodes normal  Neurologic:   CNII-XII intact, normal strength, sensation and reflexes    throughout      Assessment & Plan:    Routine Health Maintenance and Physical Exam  Immunization History  Administered  Date(s) Administered   Fluad Quad(high Dose 65+) 06/20/2020   Influenza,inj,Quad PF,6+ Mos 06/05/2018, 03/31/2019   Influenza-Unspecified 06/10/2017, 09/10/2023   Moderna Sars-Covid-2 Vaccination 06/20/2020   PFIZER(Purple Top)SARS-COV-2 Vaccination 10/21/2019, 11/18/2019   Pneumococcal Conjugate-13 09/19/2019   Tdap 03/31/2019   Zoster Recombinant(Shingrix) 04/17/2016, 04/07/2017, 09/02/2017    Health Maintenance  Topic Date Due   MAMMOGRAM  01/22/2024   DEXA SCAN  01/28/2024   INFLUENZA VACCINE  02/26/2024   COVID-19 Vaccine (4 - 2024-25 season) 03/24/2024 (Originally 03/29/2023)   Pneumococcal Vaccine: 19-49 Years (2 of 2 - PPSV23, PCV20, or PCV21) 03/08/2025 (Originally 11/14/2019)   Pneumococcal Vaccine: 50+ Years (2 of 2 - PPSV23, PCV20, or PCV21) 03/08/2025 (Originally 11/14/2019)   Cervical Cancer Screening (HPV/Pap Cotest)  06/29/2028   DTaP/Tdap/Td (2 - Td or Tdap) 03/30/2029   Colonoscopy  02/17/2033   Hepatitis C Screening  Completed   HIV Screening  Completed   Zoster Vaccines- Shingrix  Completed   Hepatitis B Vaccines  Aged Out   HPV VACCINES  Aged Out   Meningococcal B Vaccine  Aged Out    Discussed health benefits of physical activity, and encouraged her to engage in regular exercise appropriate for her age and condition.  Danielle Kim was seen today for annual exam.  Diagnoses and all orders for this visit:  Routine physical examination -     CBC with Differential/Platelet -     CMP14+EGFR -     Lipid panel -     TSH -     VITAMIN D  25 Hydroxy (Vit-D Deficiency, Fractures) -     MM 3D SCREENING MAMMOGRAM BILATERAL BREAST; Future -     B12 and Folate Panel -     T3, free -     T4, free -     T3, reverse -     Testosterone  -     Estradiol  -     Estrogens , total -     Progesterone  -     Hemoglobin A1c -     C-peptide -     Lipoprotein Analysis by NMR  Post-menopausal -     VITAMIN D  25 Hydroxy (Vit-D Deficiency, Fractures) -     B12 and Folate  Panel -     Testosterone  -     Estradiol  -     Estrogens , total -     Progesterone   Vitamin D  insufficiency -     VITAMIN D  25 Hydroxy (Vit-D Deficiency, Fractures)  Elevated LDL cholesterol level -     CMP14+EGFR -     Lipid panel -     Lipoprotein Analysis by NMR  Herpes simplex vulvovaginitis -     valACYclovir  (VALTREX ) 500 MG tablet; Take 1 tablet (500 mg total) by mouth daily.  CLL (chronic lymphocytic leukemia) (HCC) -     CBC with Differential/Platelet -     CMP14+EGFR  Thyroid disorder screen -     TSH -     T3, free -     T4, free -     T3, reverse  Elevated fasting glucose -     CMP14+EGFR -     Hemoglobin A1c -     C-peptide  Arthralgia, unspecified joint -     meloxicam  (MOBIC ) 15 MG tablet; One tab PO every 24 hours with a meal for 2 weeks, then once every 24 hours prn pain.   .. Discussed 150 minutes of exercise a week.  Encouraged vitamin D  1000 units and Calcium 1300mg  or 4 servings of dairy a day.  PHQ no concerns No falls Fasting labs ordered Mobic  and valtrex  refilled  Mammogram ordered Colonoscopy UTD Pap UTD Pt requested REMS scan instead of DEXA they do not do those downstairs. Will have to look for a place and she may have to pay out of pocket Declined all vaccines today   Shellie Rogoff, PA-C

## 2024-03-09 ENCOUNTER — Ambulatory Visit: Payer: Self-pay | Admitting: Physician Assistant

## 2024-03-09 NOTE — Progress Notes (Signed)
 [name],   Stable WBC elevation.  A1C up but still pre-diabetes range. Recheck in 6 months. Continue to work on low [place].  Thyroid looks good.  Vitamin D  looks GREAT.  B12 and folate look good.  Hormones appropriate for age.   Alk phos is a little elevated but likely due to the leukemia.   HDL, good cholesterol, looks great.  TG improved LDL not far off from optimal   10 year cardiovascular risk is below 7.5 which is great.   Danielle Kim.The 10-year ASCVD risk score (Arnett DK, et al., 2019) is: 3.1%   Values used to calculate the score:     Age: 63 years     Clincally relevant sex: Female     Is Non-Hispanic African American: No     Diabetic: No     Tobacco smoker: No     Systolic Blood Pressure: 122 mmHg     Is BP treated: No     HDL Cholesterol: 73 mg/dL     Total Cholesterol: 198 mg/dL

## 2024-03-10 ENCOUNTER — Telehealth: Payer: Self-pay

## 2024-03-10 NOTE — Telephone Encounter (Signed)
 Copied from CRM [id number]. Topic: General - Other >> Mar 09, 2024  5:19 PM Kevelyn M wrote: Reason for CRM: Patient calling Kim back.

## 2024-03-10 NOTE — Telephone Encounter (Signed)
 Patient had seen lab results in Mychart . I left a voice mail message on her home #  that this was the reason for the call and she can contact us  with any questions she may have. Left our return contact information on voice mail

## 2024-03-11 LAB — CBC WITH DIFFERENTIAL/PLATELET
Basophils Absolute: 0.1 x10E3/uL (ref 0.0–0.2)
Basos: 0 %
EOS (ABSOLUTE): 0.1 x10E3/uL (ref 0.0–0.4)
Eos: 1 %
Hematocrit: 43.5 % (ref 34.0–46.6)
Hemoglobin: 13.9 g/dL (ref 11.1–15.9)
Immature Grans (Abs): 0 x10E3/uL (ref 0.0–0.1)
Immature Granulocytes: 0 %
Lymphocytes Absolute: 20.2 x10E3/uL — ABNORMAL HIGH (ref 0.7–3.1)
Lymphs: 85 %
MCH: 29.3 pg (ref 26.6–33.0)
MCHC: 32 g/dL (ref 31.5–35.7)
MCV: 92 fL (ref 79–97)
Monocytes Absolute: 0.5 x10E3/uL (ref 0.1–0.9)
Monocytes: 2 %
Neutrophils Absolute: 2.8 x10E3/uL (ref 1.4–7.0)
Neutrophils: 12 %
Platelets: 229 x10E3/uL (ref 150–450)
RBC: 4.75 x10E6/uL (ref 3.77–5.28)
RDW: 13.5 % (ref 11.7–15.4)
WBC: 23.6 x10E3/uL (ref 3.4–10.8)

## 2024-03-11 LAB — T3, FREE: T3, Free: 3 pg/mL (ref 2.0–4.4)

## 2024-03-11 LAB — CMP14+EGFR
ALT: 18 IU/L (ref 0–32)
AST: 24 IU/L (ref 0–40)
Albumin: 4.5 g/dL (ref 3.9–4.9)
Alkaline Phosphatase: 137 IU/L — ABNORMAL HIGH (ref 44–121)
BUN/Creatinine Ratio: 19 (ref 12–28)
BUN: 16 mg/dL (ref 8–27)
Bilirubin Total: 0.3 mg/dL (ref 0.0–1.2)
CO2: 20 mmol/L (ref 20–29)
Calcium: 9.5 mg/dL (ref 8.7–10.3)
Chloride: 103 mmol/L (ref 96–106)
Creatinine, Ser: 0.86 mg/dL (ref 0.57–1.00)
Globulin, Total: 2.3 g/dL (ref 1.5–4.5)
Glucose: 108 mg/dL — ABNORMAL HIGH (ref 70–99)
Potassium: 4.3 mmol/L (ref 3.5–5.2)
Sodium: 140 mmol/L (ref 134–144)
Total Protein: 6.8 g/dL (ref 6.0–8.5)
eGFR: 76 mL/min/1.73 (ref >59)

## 2024-03-11 LAB — TESTOSTERONE: Testosterone: 29 ng/dL (ref 3–67)

## 2024-03-11 LAB — LIPID PANEL
Chol/HDL Ratio: 2.7 ratio (ref 0.0–4.4)
Cholesterol, Total: 198 mg/dL (ref 100–199)
HDL: 73 mg/dL (ref >39)
LDL Chol Calc (NIH): 116 mg/dL — ABNORMAL HIGH (ref 0–99)
Triglycerides: 50 mg/dL (ref 0–149)
VLDL Cholesterol Cal: 9 mg/dL (ref 5–40)

## 2024-03-11 LAB — VITAMIN D 25 HYDROXY (VIT D DEFICIENCY, FRACTURES): Vit D, 25-Hydroxy: 79 ng/mL (ref 30.0–100.0)

## 2024-03-11 LAB — TSH: TSH: 1.23 u[IU]/mL (ref 0.450–4.500)

## 2024-03-11 LAB — PROGESTERONE: Progesterone: 0.2 ng/mL

## 2024-03-11 LAB — LIPOPROTEIN ANALYSIS BY NMR
HDL Particle Number: 36 umol/L (ref >=30.5)
LDL Particle Number: 1159 nmol/L — ABNORMAL HIGH (ref <1000)
LDL Size: 21.1 nm (ref >20.5)
LP-IR Score: <25 (ref <=45)
Small LDL Particle Number: 369 nmol/L (ref <=527)

## 2024-03-11 LAB — T4, FREE: Free T4: 1.22 ng/dL (ref 0.82–1.77)

## 2024-03-11 LAB — HEMOGLOBIN A1C
Est. average glucose Bld gHb Est-mCnc: 126 mg/dL
Hgb A1c MFr Bld: 6 % — ABNORMAL HIGH (ref 4.8–5.6)

## 2024-03-11 LAB — ESTROGENS, TOTAL: Estrogen: 98 pg/mL (ref 40–244)

## 2024-03-11 LAB — B12 AND FOLATE PANEL
Folate: 14.3 ng/mL (ref >3.0)
Vitamin B-12: 964 pg/mL (ref 232–1245)

## 2024-03-11 LAB — C-PEPTIDE: C-Peptide: 1.9 ng/mL (ref 1.1–4.4)

## 2024-03-11 LAB — ESTRADIOL: Estradiol: <5 pg/mL (ref 0.0–54.7)

## 2024-03-11 LAB — T3, REVERSE: Reverse T3, Serum: 15.1 ng/dL (ref 9.2–24.1)

## 2024-03-11 NOTE — Progress Notes (Signed)
 LDL particles came back a little elevated but size are good.

## 2024-03-15 ENCOUNTER — Telehealth: Payer: Self-pay

## 2024-03-15 MED ORDER — CYCLOBENZAPRINE HCL 10 MG PO TABS: 10.0000 mg | ORAL_TABLET | Freq: Three times a day (TID) | ORAL | 1 refills | Status: AC | PRN

## 2024-03-15 NOTE — Telephone Encounter (Signed)
 Spoke with patient. She is wanting to fill the cyclobenzaprine   just to have on hand if needed in future.

## 2024-03-15 NOTE — Telephone Encounter (Signed)
 Copied from CRM #[id number]. Topic: Clinical - Prescription Issue >> Mar 14, 2024  4:40 PM Danielle Kim wrote: Reason for CRM: Patient was calling because there was a muscle they discussed in her last appointment that [facility] said she could call in. She doesn't remember the name. But she would like it called in. Call back # [phone]

## 2024-03-15 NOTE — Telephone Encounter (Signed)
 I sent meloxicam  is she wanting a muscle relaxer as well?

## 2024-03-15 NOTE — Telephone Encounter (Signed)
 I believe it is a muscle relaxer she is requesting.

## 2024-03-21 NOTE — Telephone Encounter (Signed)
 No she is talking about REMS bone scan. Do you know if any local imaging site does these scans?

## 2024-03-21 NOTE — Telephone Encounter (Signed)
 Can we call the one in [place] and just see if they take insurance for the REMS scan?

## 2024-03-21 NOTE — Telephone Encounter (Signed)
 Called [facility] Was told that  would have to go to their website  [email or web address] Choose plan of treatment tab Then choose State And then locate a facility that offers the requested treatment.  I did this but did not see that REMS bone scan was an offered treatment.

## 2024-03-22 NOTE — Telephone Encounter (Signed)
 Called  all local radiology departments, [facility], [facility] and [facility] but no one has REMS bone scan or knowledge of where this can be done  Called Dr. Gautuam Kale with [facility]  At [phone] [place] , [place]  As he was listed as a REMS certified provider on their website so thought he might be able to assist us  more.  Left a voice  mail message requesting a return call.

## 2024-03-23 ENCOUNTER — Ambulatory Visit: Payer: Commercial Managed Care - PPO | Admitting: Hematology & Oncology

## 2024-03-23 ENCOUNTER — Inpatient Hospital Stay: Payer: Commercial Managed Care - PPO

## 2024-03-23 NOTE — Telephone Encounter (Signed)
 Spoke with Dr. Ileene Saran nurse - he is a provider for REMS  bite chemotherapy  but not for REMS bone scan - he is not aware of where we could find anyone to do this procedure .

## 2024-03-25 ENCOUNTER — Other Ambulatory Visit: Payer: Self-pay | Admitting: Physician Assistant

## 2024-03-25 DIAGNOSIS — E663 Overweight: Secondary | ICD-10-CM

## 2024-03-25 DIAGNOSIS — G478 Other sleep disorders: Secondary | ICD-10-CM

## 2024-03-29 ENCOUNTER — Encounter: Payer: Self-pay | Admitting: Sports Medicine

## 2024-05-09 ENCOUNTER — Telehealth: Payer: Self-pay

## 2024-05-09 NOTE — Telephone Encounter (Signed)
 Copied from CRM [id number]. Topic: General - Billing Inquiry >> May 09, 2024 10:24 AM Cleave MATSU wrote: Reason for CRM: pt said she had a physical done and it was paid for by insurance but they didn't pay [facility] because they said it wasn't sent over that it was apart of a physical so now she wants to speak with someone pertaining to this. Please follow up with pt

## 2024-05-10 NOTE — Telephone Encounter (Signed)
 Contacted [employer] ( spoke with Norleen BROCKS), under claim ED# [id number]- Procedure codes [id number], and 15518 were denied because they are not covered under members benefits. Procedure codes 16295, [phone], and [phone] were denied because diagnose codes were mismatched.  Codes 15517 and [phone] were not reimburseable due laboratory being independent.  Insurance paid $79.35, $79.35 applied to deductible.  Patient has $7000.00 deductible, only has met $201.24.  Reference#[id number] [phone]

## 2024-05-10 NOTE — Telephone Encounter (Signed)
 05/10/2024- Contacted patient regarding previous [facility] 03/08/2024. Patient states she received bill for $794.85. Patient mentioned that she spoke with both [employer] and [facility], claim needs to be resubmitted as part of physical in order for [employer] to pay.   Patient was asked for a copy of bill from [facility]. I will reach out to Jennifer[facility] to check the status of current bill and also see what diagnosis code was submitted.

## 2024-05-10 NOTE — Telephone Encounter (Signed)
 05/10/2024-Contacted Takisha H with [facility] regarding bill DOS 03/08/2024. Part of bill was applied to the deducible and some of the bill was not covered.The following labs was denied   -Progesterone  $139.65 -T3 free $75.00 -Estrogen $155.40 -Estradiol  $182.70 -Total=$715.50  Lipid Panel Profile,Folate and T3 reverse  were denied due to diagnosis being inconsistent.

## 2024-05-10 NOTE — Telephone Encounter (Signed)
 Copied from CRM [id number]. Topic: General - Billing Inquiry >> May 09, 2024 10:24 AM Cleave MATSU wrote: Reason for CRM: pt said she had a physical done and it was paid for by insurance but they didn't pay [facility] because they said it wasn't sent over that it was apart of a physical so now she wants to speak with someone pertaining to this. Please follow up with pt >> May 09, 2024  4:37 PM Winona R wrote: Pt states she doent need a call back she would just like for the office to fix the code. Please leave a message if you do not reach the pt.

## 2024-06-21 ENCOUNTER — Telehealth: Payer: Self-pay | Admitting: Physician Assistant

## 2024-06-21 NOTE — Telephone Encounter (Signed)
 Copied from CRM #[id number]. Topic: General - Billing Inquiry >> Jun 21, 2024 10:07 AM Everette C wrote: Reason for CRM: Julisa with [facility] has called to follow up on a previous request for claim correction for labs from the patient's physical on 03/08/24 The patient's labwork was not coded as preventative. Julisa would like to know if it's possible to have the labwork re-coded and re filed in order for it to be covered by [employer].   Please contact Julisa at [phone]

## 2024-06-23 DIAGNOSIS — L237 Allergic contact dermatitis due to plants, except food: Secondary | ICD-10-CM | POA: Diagnosis not present

## 2024-06-26 DIAGNOSIS — R21 Rash and other nonspecific skin eruption: Secondary | ICD-10-CM | POA: Diagnosis not present

## 2024-07-04 ENCOUNTER — Other Ambulatory Visit: Payer: Self-pay | Admitting: Physician Assistant

## 2024-07-04 DIAGNOSIS — A6004 Herpesviral vulvovaginitis: Secondary | ICD-10-CM

## 2024-07-04 MED ORDER — VALACYCLOVIR HCL 500 MG PO TABS: 500.0000 mg | ORAL_TABLET | Freq: Every day | ORAL | 0 refills | Status: AC

## 2024-07-04 NOTE — Telephone Encounter (Signed)
 Copied from CRM #[id number]. Topic: Clinical - Medication Question >> Jul 04, 2024 11:03 AM Mercer PEDLAR wrote: Reason for CRM: Patient is having a herpes outbreak while out of town taking care of her sister post surgery and does not have her meds with her. She is requesting a 3 day supply of valACYclovir  (VALTREX ) 500 MG tablet [facility] [place]. Patient stated that she does not mind paying out of pocket if insurance won't cover it due to refill not being due yet and she is requesting a callback if there are any issues because she will have to drive for an hour to get her meds from home.   [id number] [place] [place] - [address] AT [facility] [address] [place]  [address] Phone: [phone] Fax: [phone]

## 2024-07-07 ENCOUNTER — Telehealth: Payer: Self-pay

## 2024-07-07 NOTE — Telephone Encounter (Signed)
 Copied from CRM #[id number]. Topic: General - Billing Inquiry >> Jul 06, 2024  4:04 PM Selinda RAMAN wrote: Reason for CRM: The patient called in with a question about a bill and I relayed the billing departments number and transferred the call per her request. >> Jul 06, 2024  4:31 PM Vivian Z wrote: Patient is calling regarding a large [facility] bill she received after seeing PA Vermell Bologna due to the way it was coded when billing. Patient stated that the billing department was not able to help and they adviced her that she needs to contact the person who placed the lab order on 03/08/2024 when she got her physical completed and request that they resubmit the billing and list it as a part of preventative care lab. Patient is requesting a callback with a status update on the process.

## 2024-07-11 NOTE — Telephone Encounter (Signed)
 Spoke with  Jon,  will need some additional coding for the hormone and thyroid testing as there was not anything in the chart notes to add for this?

## 2024-08-18 NOTE — Telephone Encounter (Signed)
 A new thread was started. Spoke with Ms Danielle Kim today to see if knew the exact amount of the invoice. [employer] refused to talk to me about the claim without the exact dollar amount but it was sent to [facility]. I have sent a message to Delon Blush at [facility] to see if she can give us  more information.

## 2024-08-18 NOTE — Telephone Encounter (Signed)
 I am having to get Delon Blush , the lead at [facility] to help me. Im still waiting on her response.  Thank you ,  Anjelica

## 2024-08-19 NOTE — Telephone Encounter (Signed)
 Tiffany, can we call patient and keep her in the loop of what we are doing?

## 2024-08-23 NOTE — Telephone Encounter (Signed)
 Yes I have been in communication with Ms Danielle Kim the entire time. She is out of town this weekend but she knows what's going on.
